# Patient Record
Sex: Female | Born: 1995 | Race: White | Hispanic: No | Marital: Single | Smoking: Never smoker
Health system: Southern US, Community
[De-identification: ages and names within clinical notes are randomized; demographics above are authoritative.]

## PROBLEM LIST (undated history)

## (undated) DIAGNOSIS — Z789 Other specified health status: Secondary | ICD-10-CM

---
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
## 2015-02-01 ENCOUNTER — Encounter (HOSPITAL_COMMUNITY): Payer: Self-pay | Admitting: Emergency Medicine

## 2015-02-01 ENCOUNTER — Emergency Department (INDEPENDENT_AMBULATORY_CARE_PROVIDER_SITE_OTHER)
Admission: EM | Admit: 2015-02-01 | Discharge: 2015-02-01 | Disposition: A | Discharge status: Home / Self Care | Payer: Medicaid Other | Source: Home / Self Care | Attending: Family Medicine | Admitting: Family Medicine

## 2015-02-01 DIAGNOSIS — L247 Irritant contact dermatitis due to plants, except food: Secondary | ICD-10-CM

## 2015-02-01 MED ORDER — PREDNISONE 10 MG PO TABS: ORAL_TABLET | ORAL | Status: DC

## 2015-02-01 MED ORDER — HYDROXYZINE HCL 25 MG PO TABS: 25.0000 mg | ORAL_TABLET | ORAL | Status: DC | PRN

## 2015-02-01 NOTE — Discharge Instructions (Signed)
Poison Ivy  Poison ivy is a inflammation of the skin (contact dermatitis) caused by touching the allergens on the leaves of the ivy plant following previous exposure to the plant. The rash usually appears 48 hours after exposure. The rash is usually bumps (papules) or blisters (vesicles) in a linear pattern. Depending on your own sensitivity, the rash may simply cause redness and itching, or it may also progress to blisters which may break open. These must be well cared for to prevent secondary bacterial (germ) infection, followed by scarring. Keep any open areas dry, clean, dressed, and covered with an antibacterial ointment if needed. The eyes may also get puffy. The puffiness is worst in the morning and gets better as the day progresses. This dermatitis usually heals without scarring, within 2 to 3 weeks without treatment.  HOME CARE INSTRUCTIONS   Thoroughly wash with soap and water as soon as you have been exposed to poison ivy. You have about one half hour to remove the plant resin before it will cause the rash. This washing will destroy the oil or antigen on the skin that is causing, or will cause, the rash. Be sure to wash under your fingernails as any plant resin there will continue to spread the rash. Do not rub skin vigorously when washing affected area. Poison ivy cannot spread if no oil from the plant remains on your body. A rash that has progressed to weeping sores will not spread the rash unless you have not washed thoroughly. It is also important to wash any clothes you have been wearing as these may carry active allergens. The rash will return if you wear the unwashed clothing, even several days later.  Avoidance of the plant in the future is the best measure. Poison ivy plant can be recognized by the number of leaves. Generally, poison ivy has three leaves with flowering branches on a single stem.  Diphenhydramine may be purchased over the counter and used as needed for itching. Do not drive with  this medication if it makes you drowsy.Ask your caregiver about medication for children.  SEEK MEDICAL CARE IF:  · Open sores develop.  · Redness spreads beyond area of rash.  · You notice purulent (pus-like) discharge.  · You have increased pain.  · Other signs of infection develop (such as fever).  Document Released: 11/12/2000 Document Revised: 02/07/2012 Document Reviewed: 04/25/2009  ExitCare® Patient Information ©2015 [employer]. This information is not intended to replace advice given to you by your health care provider. Make sure you discuss any questions you have with your health care provider.    Contact Dermatitis  Contact dermatitis is a reaction to certain substances that touch the skin. Contact dermatitis can be either irritant contact dermatitis or allergic contact dermatitis. Irritant contact dermatitis does not require previous exposure to the substance for a reaction to occur. Allergic contact dermatitis only occurs if you have been exposed to the substance before. Upon a repeat exposure, your body reacts to the substance.   CAUSES   Many substances can cause contact dermatitis. Irritant dermatitis is most commonly caused by repeated exposure to mildly irritating substances, such as:  · Makeup.  · Soaps.  · Detergents.  · Bleaches.  · Acids.  · Metal salts, such as nickel.  Allergic contact dermatitis is most commonly caused by exposure to:  · Poisonous plants.  · Chemicals (deodorants, shampoos).  · Jewelry.  · Latex.  · Neomycin in triple antibiotic cream.  · Preservatives in products, including   clothing.  SYMPTOMS   The area of skin that is exposed may develop:  · Dryness or flaking.  · Redness.  · Cracks.  · Itching.  · Pain or a burning sensation.  · Blisters.  With allergic contact dermatitis, there may also be swelling in areas such as the eyelids, mouth, or genitals.   DIAGNOSIS   Your caregiver can usually tell what the problem is by doing a physical exam. In cases where the cause is  uncertain and an allergic contact dermatitis is suspected, a patch skin test may be performed to help determine the cause of your dermatitis.  TREATMENT  Treatment includes protecting the skin from further contact with the irritating substance by avoiding that substance if possible. Barrier creams, powders, and gloves may be helpful. Your caregiver may also recommend:  · Steroid creams or ointments applied 2 times daily. For best results, soak the rash area in cool water for 20 minutes. Then apply the medicine. Cover the area with a plastic wrap. You can store the steroid cream in the refrigerator for a "chilly" effect on your rash. That may decrease itching. Oral steroid medicines may be needed in more severe cases.  · Antibiotics or antibacterial ointments if a skin infection is present.  · Antihistamine lotion or an antihistamine taken by mouth to ease itching.  · Lubricants to keep moisture in your skin.  · Burow's solution to reduce redness and soreness or to dry a weeping rash. Mix one packet or tablet of solution in 2 cups cool water. Dip a clean washcloth in the mixture, wring it out a bit, and put it on the affected area. Leave the cloth in place for 30 minutes. Do this as often as possible throughout the day.  · Taking several cornstarch or baking soda baths daily if the area is too large to cover with a washcloth.  Harsh chemicals, such as alkalis or acids, can cause skin damage that is like a burn. You should flush your skin for 15 to 20 minutes with cold water after such an exposure. You should also seek immediate medical care after exposure. Bandages (dressings), antibiotics, and pain medicine may be needed for severely irritated skin.   HOME CARE INSTRUCTIONS  · Avoid the substance that caused your reaction.  · Keep the area of skin that is affected away from hot water, soap, sunlight, chemicals, acidic substances, or anything else that would irritate your skin.  · Do not scratch the rash. Scratching  may cause the rash to become infected.  · You may take cool baths to help stop the itching.  · Only take over-the-counter or prescription medicines as directed by your caregiver.  · See your caregiver for follow-up care as directed to make sure your skin is healing properly.  SEEK MEDICAL CARE IF:   · Your condition is not better after 3 days of treatment.  · You seem to be getting worse.  · You see signs of infection such as swelling, tenderness, redness, soreness, or warmth in the affected area.  · You have any problems related to your medicines.  Document Released: 11/12/2000 Document Revised: 02/07/2012 Document Reviewed: 04/20/2011  ExitCare® Patient Information ©2015 [employer]. This information is not intended to replace advice given to you by your health care provider. Make sure you discuss any questions you have with your health care provider.

## 2015-02-01 NOTE — ED Notes (Signed)
Patient c/o poison ivy on her face arms and bilat thighs x 4 days. Patient reports she thinks they cat was in poison ivy. Patient reports she took a benadryl and felt better. Also has a sore throat not sure if it related to the poison ivy. Denies trouble breathing. Patient is in NAD.

## 2015-02-01 NOTE — ED Provider Notes (Signed)
CSN: [id number]     Arrival date & time 02/01/15  1807 History   First MD Initiated Contact with Patient 02/01/15 1858     Chief Complaint  Patient presents with  . Poison Ivy   (Consider location/radiation/quality/duration/timing/severity/associated sxs/prior Treatment) HPI       19 year old female presents for evaluation of poison ivy. She has an itchy rash consistent with poison ivy on her left arm, left cheek, and spread out multiple places over the rest of her body. She also has some soreness in her throat. She has gotten poison ivy in the past from her cat, she says her cat runs outside and has passed her poison ivy this way. No difficulty breathing or throat swelling.  History reviewed. No pertinent past medical history. History reviewed. No pertinent past surgical history. History reviewed. No pertinent family history. History  Substance Use Topics  . Smoking status: Light Tobacco Smoker  . Smokeless tobacco: Not on file  . Alcohol Use: Not on file   OB History    No data available     Review of Systems  Constitutional: Negative for fever and chills.  HENT: Positive for sore throat. Negative for congestion.   Respiratory: Negative for shortness of breath.   Cardiovascular: Negative for chest pain.  Musculoskeletal: Negative for myalgias and arthralgias.  Skin: Positive for rash.  All other systems reviewed and are negative.   Allergies  Review of patient's allergies indicates no known allergies.  Home Medications   Prior to Admission medications   Medication Sig Start Date End Date Taking? Authorizing Provider  hydrOXYzine (ATARAX/VISTARIL) 25 MG tablet Take 1 tablet (25 mg total) by mouth every 4 (four) hours as needed for itching. 02/01/15   Graylon Good, PA-C  predniSONE (DELTASONE) 10 MG tablet 4 tabs PO QD for 4 days; 3 tabs PO QD for 3 days; 2 tabs PO QD for 2 days; 1 tab PO QD for 1 day 02/01/15   Graylon Good, PA-C   BP 120/52 mmHg  Pulse 67  Temp(Src) 99  F (37.2 C) (Oral)  Resp 14  SpO2 98%  LMP 01/30/2015 (Exact Date) Physical Exam  Constitutional: She is oriented to person, place, and time. Vital signs are normal. She appears well-developed and well-nourished. No distress.  HENT:  Head: Normocephalic and atraumatic.  No posterior pharyngeal edema  Pulmonary/Chest: Effort normal. No respiratory distress.  Neurological: She is alert and oriented to person, place, and time. She has normal strength. Coordination normal.  Skin: Skin is warm and dry. Rash ( there is an erythematous maculopapular and crusting rash in a linear distribution in the areas described in the history of present illness) noted. She is not diaphoretic.  Psychiatric: She has a normal mood and affect. Judgment normal.  Nursing note and vitals reviewed.   ED Course  Procedures (including critical care time) Labs Review Labs Reviewed - No data to display  Imaging Review No results found.   MDM   1. Irritant contact dermatitis due to plant    Treat with prednisone, hydroxyzine. Follow-up when necessary if worsening   Meds ordered this encounter  Medications  . predniSONE (DELTASONE) 10 MG tablet    Sig: 4 tabs PO QD for 4 days; 3 tabs PO QD for 3 days; 2 tabs PO QD for 2 days; 1 tab PO QD for 1 day    Dispense:  30 tablet    Refill:  0  . hydrOXYzine (ATARAX/VISTARIL) 25 MG tablet  Sig: Take 1 tablet (25 mg total) by mouth every 4 (four) hours as needed for itching.    Dispense:  20 tablet    Refill:  0       Graylon GoodZachary H Maykayla Highley, PA-C 02/01/15 1911

## 2016-03-21 ENCOUNTER — Encounter (HOSPITAL_COMMUNITY): Payer: Self-pay

## 2016-03-21 ENCOUNTER — Emergency Department (HOSPITAL_COMMUNITY)
Admission: EM | Admit: 2016-03-21 | Discharge: 2016-03-21 | Disposition: A | Discharge status: Home / Self Care | Payer: Medicaid Other | Attending: Emergency Medicine | Admitting: Emergency Medicine

## 2016-03-21 DIAGNOSIS — T63481A Toxic effect of venom of other arthropod, accidental (unintentional), initial encounter: Secondary | ICD-10-CM | POA: Diagnosis not present

## 2016-03-21 DIAGNOSIS — F17200 Nicotine dependence, unspecified, uncomplicated: Secondary | ICD-10-CM | POA: Diagnosis not present

## 2016-03-21 DIAGNOSIS — Z23 Encounter for immunization: Secondary | ICD-10-CM | POA: Diagnosis not present

## 2016-03-21 DIAGNOSIS — Y998 Other external cause status: Secondary | ICD-10-CM | POA: Diagnosis not present

## 2016-03-21 DIAGNOSIS — Y9389 Activity, other specified: Secondary | ICD-10-CM | POA: Insufficient documentation

## 2016-03-21 DIAGNOSIS — Y9289 Other specified places as the place of occurrence of the external cause: Secondary | ICD-10-CM | POA: Insufficient documentation

## 2016-03-21 DIAGNOSIS — R21 Rash and other nonspecific skin eruption: Secondary | ICD-10-CM | POA: Diagnosis present

## 2016-03-21 MED ORDER — PREDNISONE 20 MG PO TABS
40.0000 mg | ORAL_TABLET | Freq: Once | ORAL | Status: AC
  Administered 2016-03-21: 40 mg via ORAL
  Filled 2016-03-21: qty 2

## 2016-03-21 MED ORDER — TETANUS-DIPHTH-ACELL PERTUSSIS 5-2.5-18.5 LF-MCG/0.5 IM SUSP
0.5000 mL | Freq: Once | INTRAMUSCULAR | Status: AC
  Administered 2016-03-21: 0.5 mL via INTRAMUSCULAR
  Filled 2016-03-21: qty 0.5

## 2016-03-21 MED ORDER — FAMOTIDINE 20 MG PO TABS
20.0000 mg | ORAL_TABLET | Freq: Once | ORAL | Status: AC
  Administered 2016-03-21: 20 mg via ORAL
  Filled 2016-03-21: qty 1

## 2016-03-21 MED ORDER — PREDNISONE 10 MG PO TABS: 20.0000 mg | ORAL_TABLET | Freq: Two times a day (BID) | ORAL | Status: DC

## 2016-03-21 MED ORDER — FAMOTIDINE 20 MG PO TABS: 20.0000 mg | ORAL_TABLET | Freq: Two times a day (BID) | ORAL | Status: DC

## 2016-03-21 MED ORDER — DIPHENHYDRAMINE HCL 25 MG PO CAPS
25.0000 mg | ORAL_CAPSULE | Freq: Once | ORAL | Status: AC
  Administered 2016-03-21: 25 mg via ORAL
  Filled 2016-03-21: qty 1

## 2016-03-21 MED ORDER — LORATADINE 10 MG PO TABS: 10.0000 mg | ORAL_TABLET | Freq: Every day | ORAL | Status: DC

## 2016-03-21 NOTE — ED Provider Notes (Signed)
CSN: [id number]     Arrival date & time 03/21/16  1839 History   First MD Initiated Contact with Patient 03/21/16 1859     Chief Complaint  Patient presents with  . Rash     (Consider location/radiation/quality/duration/timing/severity/associated sxs/prior Treatment) The history is provided by the patient.   Tina Walsh is a 20 y.o. female who presents to the ED with an area to the left shoulder that she thinks may be a spider bite. She reports that she camped out last night and something bit her. She denies difficulty swallowing or sore throat, wheezing, shortness of breath or other problems.   History reviewed. No pertinent past medical history. History reviewed. No pertinent past surgical history. History reviewed. No pertinent family history. Social History  Substance Use Topics  . Smoking status: Light Tobacco Smoker  . Smokeless tobacco: None  . Alcohol Use: No   OB History    No data available     Review of Systems Negative except as stated in HPI   Allergies  Review of patient's allergies indicates no known allergies.  Home Medications   Prior to Admission medications   Medication Sig Start Date End Date Taking? Authorizing Provider  famotidine (PEPCID) 20 MG tablet Take 1 tablet (20 mg total) by mouth 2 (two) times daily. 03/21/16   Kadeisha Betsch Orlene OchM Deliliah Spranger, NP  loratadine (CLARITIN) 10 MG tablet Take 1 tablet (10 mg total) by mouth daily. 03/21/16   Danie Diehl Orlene OchM Ladeja Pelham, NP  predniSONE (DELTASONE) 10 MG tablet Take 2 tablets (20 mg total) by mouth 2 (two) times daily with a meal. 03/21/16   Sabriel Borromeo Orlene OchM Tijah Hane, NP   There were no vitals taken for this visit. Physical Exam  Constitutional: She is oriented to person, place, and time. She appears well-developed and well-nourished. No distress.  HENT:  Head: Normocephalic and atraumatic.  Mouth/Throat: Uvula is midline, oropharynx is clear and moist and mucous membranes are normal.  Eyes: Conjunctivae and EOM are normal.  Neck: Normal  range of motion. Neck supple.  Cardiovascular: Normal rate and regular rhythm.   Pulmonary/Chest: Effort normal and breath sounds normal.  Musculoskeletal: Normal range of motion.  Neurological: She is alert and oriented to person, place, and time. No cranial nerve deficit.  Skin: Skin is warm and dry.  There is a 3 cm area of erythema with a central tiny vesicle. There is no red streaking or signs of infection.  Psychiatric: She has a normal mood and affect. Her behavior is normal.  Nursing note and vitals reviewed.   ED Course  Procedures (including critical care time) Labs Review Labs Reviewed - No data to display   MDM  20 y.o. female with redness and itching to the back c/w insect bite. Stable for d/c without swelling of the throat, wheezing or signs of infection. Will treat with Prednisone, Claritin, Pepcid and she will return for worsening symptoms. Tetanus updated.   Final diagnoses:  Local reaction to insect sting, accidental or unintentional, initial encounter        [facility] M Vyom Brass, NP 03/21/16 1932  Gerhard Munchobert Lockwood, MD 03/22/16 [phone]

## 2016-03-21 NOTE — Discharge Instructions (Signed)
Return if you have increased redness with red streaking, wheezing, swelling of your throat or drainage from the area or any other problems.

## 2016-03-21 NOTE — ED Notes (Signed)
Pt states she was camping last night, noticed "spider bite" this afternoon. Pt has large red rash to L upper back.

## 2016-03-21 NOTE — ED Notes (Signed)
See providers assessment.  

## 2016-03-21 NOTE — ED Notes (Signed)
Pt called x'3, no response 

## 2016-04-14 ENCOUNTER — Emergency Department (HOSPITAL_COMMUNITY): Payer: Medicaid Other | Admitting: Anesthesiology

## 2016-04-14 ENCOUNTER — Emergency Department (HOSPITAL_COMMUNITY): Payer: Medicaid Other

## 2016-04-14 ENCOUNTER — Encounter (HOSPITAL_COMMUNITY)
Admission: EM | Disposition: A | Discharge status: Home / Self Care | Payer: Self-pay | Source: Home / Self Care | Attending: Emergency Medicine

## 2016-04-14 ENCOUNTER — Ambulatory Visit (HOSPITAL_COMMUNITY)
Admission: EM | Admit: 2016-04-14 | Discharge: 2016-04-15 | Disposition: A | Discharge status: Home / Self Care | Payer: Medicaid Other | Attending: Emergency Medicine | Admitting: Emergency Medicine

## 2016-04-14 ENCOUNTER — Encounter (HOSPITAL_COMMUNITY): Payer: Self-pay | Admitting: Emergency Medicine

## 2016-04-14 DIAGNOSIS — S61431A Puncture wound without foreign body of right hand, initial encounter: Secondary | ICD-10-CM

## 2016-04-14 DIAGNOSIS — S61441A Puncture wound with foreign body of right hand, initial encounter: Secondary | ICD-10-CM | POA: Diagnosis not present

## 2016-04-14 DIAGNOSIS — S60551A Superficial foreign body of right hand, initial encounter: Secondary | ICD-10-CM | POA: Diagnosis present

## 2016-04-14 DIAGNOSIS — W2209XA Striking against other stationary object, initial encounter: Secondary | ICD-10-CM | POA: Diagnosis not present

## 2016-04-14 DIAGNOSIS — F17200 Nicotine dependence, unspecified, uncomplicated: Secondary | ICD-10-CM | POA: Insufficient documentation

## 2016-04-14 DIAGNOSIS — W228XXA Striking against or struck by other objects, initial encounter: Secondary | ICD-10-CM | POA: Diagnosis not present

## 2016-04-14 HISTORY — PX: I & D EXTREMITY: SHX5045

## 2016-04-14 LAB — CBC WITH DIFFERENTIAL/PLATELET
Basophils Absolute: 0 10*3/uL (ref 0.0–0.1)
Basophils Relative: 0 %
Eosinophils Absolute: 0.2 10*3/uL (ref 0.0–0.7)
Eosinophils Relative: 2 %
HEMATOCRIT: 33.6 % — AB (ref 36.0–46.0)
HEMOGLOBIN: 11.2 g/dL — AB (ref 12.0–15.0)
LYMPHS ABS: 1.5 10*3/uL (ref 0.7–4.0)
LYMPHS PCT: 18 %
MCH: 28.9 pg (ref 26.0–34.0)
MCHC: 33.3 g/dL (ref 30.0–36.0)
MCV: 86.8 fL (ref 78.0–100.0)
MONOS PCT: 6 %
Monocytes Absolute: 0.5 10*3/uL (ref 0.1–1.0)
NEUTROS ABS: 6.5 10*3/uL (ref 1.7–7.7)
NEUTROS PCT: 74 %
Platelets: 254 10*3/uL (ref 150–400)
RBC: 3.87 MIL/uL (ref 3.87–5.11)
RDW: 13.7 % (ref 11.5–15.5)
WBC: 8.8 10*3/uL (ref 4.0–10.5)

## 2016-04-14 LAB — BASIC METABOLIC PANEL
Anion gap: 7 (ref 5–15)
BUN: 13 mg/dL (ref 6–20)
CHLORIDE: 106 mmol/L (ref 101–111)
CO2: 26 mmol/L (ref 22–32)
CREATININE: 0.65 mg/dL (ref 0.44–1.00)
Calcium: 9.2 mg/dL (ref 8.9–10.3)
GFR calc Af Amer: >60 mL/min (ref >60)
GFR calc non Af Amer: >60 mL/min (ref >60)
Glucose, Bld: 105 mg/dL — ABNORMAL HIGH (ref 65–99)
POTASSIUM: 3.9 mmol/L (ref 3.5–5.1)
Sodium: 139 mmol/L (ref 135–145)

## 2016-04-14 SURGERY — IRRIGATION AND DEBRIDEMENT EXTREMITY
Anesthesia: General | Site: Hand | Laterality: Right

## 2016-04-14 MED ORDER — MIDAZOLAM HCL 2 MG/2ML IJ SOLN
INTRAMUSCULAR | Status: AC
  Filled 2016-04-14: qty 2

## 2016-04-14 MED ORDER — CEFAZOLIN SODIUM 1 G IJ SOLR
INTRAMUSCULAR | Status: AC
  Filled 2016-04-14: qty 40

## 2016-04-14 MED ORDER — FENTANYL CITRATE (PF) 250 MCG/5ML IJ SOLN
INTRAMUSCULAR | Status: AC
  Filled 2016-04-14: qty 5

## 2016-04-14 MED ORDER — CEFAZOLIN SODIUM 1-5 GM-% IV SOLN
1.0000 g | Freq: Once | INTRAVENOUS | Status: AC
  Administered 2016-04-15: 1 g via INTRAVENOUS

## 2016-04-14 MED ORDER — MORPHINE SULFATE (PF) 4 MG/ML IV SOLN
4.0000 mg | Freq: Once | INTRAVENOUS | Status: AC
Start: 2016-04-14 — End: 2016-04-14
  Administered 2016-04-14: 4 mg via INTRAVENOUS
  Filled 2016-04-14: qty 1

## 2016-04-14 MED ORDER — MORPHINE SULFATE (PF) 4 MG/ML IV SOLN
4.0000 mg | Freq: Once | INTRAVENOUS | Status: AC
  Administered 2016-04-14: 4 mg via INTRAVENOUS
  Filled 2016-04-14: qty 1

## 2016-04-14 SURGICAL SUPPLY — 52 items
BANDAGE ELASTIC 3 VELCRO ST LF (GAUZE/BANDAGES/DRESSINGS) IMPLANT
BANDAGE ELASTIC 4 VELCRO ST LF (GAUZE/BANDAGES/DRESSINGS) ×2 IMPLANT
BNDG CMPR 9X4 STRL LF SNTH (GAUZE/BANDAGES/DRESSINGS)
BNDG CONFORM 2 STRL LF (GAUZE/BANDAGES/DRESSINGS) IMPLANT
BNDG ESMARK 4X9 LF (GAUZE/BANDAGES/DRESSINGS) IMPLANT
BNDG GAUZE ELAST 4 BULKY (GAUZE/BANDAGES/DRESSINGS) ×2 IMPLANT
CORDS BIPOLAR (ELECTRODE) IMPLANT
COVER SURGICAL LIGHT HANDLE (MISCELLANEOUS) IMPLANT
CUFF TOURNIQUET SINGLE 18IN (TOURNIQUET CUFF) IMPLANT
DECANTER SPIKE VIAL GLASS SM (MISCELLANEOUS) IMPLANT
DRAIN PENROSE 1/4X12 LTX STRL (WOUND CARE) IMPLANT
DRAPE SURG 17X23 STRL (DRAPES) IMPLANT
DRSG PAD ABDOMINAL 8X10 ST (GAUZE/BANDAGES/DRESSINGS) IMPLANT
DURAPREP 26ML APPLICATOR (WOUND CARE) IMPLANT
ELECT REM PT RETURN 9FT ADLT (ELECTROSURGICAL)
ELECTRODE REM PT RTRN 9FT ADLT (ELECTROSURGICAL) IMPLANT
GAUZE PACKING IODOFORM 1/4X5 (PACKING) IMPLANT
GAUZE SPONGE 4X4 12PLY STRL (GAUZE/BANDAGES/DRESSINGS) IMPLANT
GAUZE XEROFORM 1X8 LF (GAUZE/BANDAGES/DRESSINGS) IMPLANT
GLOVE SURG SYN 8.0 (GLOVE) ×2 IMPLANT
GOWN STRL REUS W/ TWL LRG LVL3 (GOWN DISPOSABLE) ×1 IMPLANT
GOWN STRL REUS W/ TWL XL LVL3 (GOWN DISPOSABLE) IMPLANT
GOWN STRL REUS W/TWL LRG LVL3 (GOWN DISPOSABLE) ×2
GOWN STRL REUS W/TWL XL LVL3 (GOWN DISPOSABLE)
HANDPIECE INTERPULSE COAX TIP (DISPOSABLE)
KIT BASIN OR (CUSTOM PROCEDURE TRAY) ×2 IMPLANT
KIT ROOM TURNOVER OR (KITS) IMPLANT
MANIFOLD NEPTUNE II (INSTRUMENTS) IMPLANT
NEEDLE HYPO 25GX1X1/2 BEV (NEEDLE) IMPLANT
NEEDLE HYPO 25X1 1.5 SAFETY (NEEDLE) ×2 IMPLANT
NS IRRIG 1000ML POUR BTL (IV SOLUTION) ×2 IMPLANT
PACK ORTHO EXTREMITY (CUSTOM PROCEDURE TRAY) IMPLANT
PAD ARMBOARD 7.5X6 YLW CONV (MISCELLANEOUS) ×4 IMPLANT
PAD CAST 4YDX4 CTTN HI CHSV (CAST SUPPLIES) ×2 IMPLANT
PADDING CAST COTTON 4X4 STRL (CAST SUPPLIES) ×4
SCRUB BETADINE 4OZ XXX (MISCELLANEOUS) ×2 IMPLANT
SET HNDPC FAN SPRY TIP SCT (DISPOSABLE) IMPLANT
SOLUTION BETADINE 4OZ (MISCELLANEOUS) ×2 IMPLANT
SPONGE GAUZE 4X4 12PLY STER LF (GAUZE/BANDAGES/DRESSINGS) ×2 IMPLANT
SPONGE LAP 18X18 X RAY DECT (DISPOSABLE) IMPLANT
SUCTION FRAZIER HANDLE 10FR (MISCELLANEOUS)
SUCTION TUBE FRAZIER 10FR DISP (MISCELLANEOUS) IMPLANT
SUT VICRYL RAPIDE 4/0 PS 2 (SUTURE) IMPLANT
SYR 20CC LL (SYRINGE) ×2 IMPLANT
SYR CONTROL 10ML LL (SYRINGE) IMPLANT
TOWEL OR 17X24 6PK STRL BLUE (TOWEL DISPOSABLE) IMPLANT
TOWEL OR 17X26 10 PK STRL BLUE (TOWEL DISPOSABLE) ×2 IMPLANT
TUBE ANAEROBIC SPECIMEN COL (MISCELLANEOUS) IMPLANT
TUBE CONNECTING 12X1/4 (SUCTIONS) ×2 IMPLANT
UNDERPAD 30X30 INCONTINENT (UNDERPADS AND DIAPERS) IMPLANT
WATER STERILE IRR 1000ML POUR (IV SOLUTION) IMPLANT
YANKAUER SUCT BULB TIP NO VENT (SUCTIONS) IMPLANT

## 2016-04-14 NOTE — ED Notes (Signed)
Pt in EMS. Pt was impaled by chain link fence while trying to catch a dog. Pt rates pain 6/10. PVA intact. Given 150 fentanyl en route. VSS

## 2016-04-14 NOTE — Anesthesia Preprocedure Evaluation (Signed)
Anesthesia Evaluation  Patient identified by MRN, date of birth, ID band Patient awake    Reviewed: Allergy & Precautions, H&P , NPO status , Patient's Chart, lab work & pertinent test results  History of Anesthesia Complications Negative for: history of anesthetic complications  Airway Mallampati: I   Neck ROM: full    Dental no notable dental hx.    Pulmonary neg pulmonary ROS, Current Smoker,    Pulmonary exam normal breath sounds clear to auscultation       Cardiovascular negative cardio ROS  I Rhythm:regular Rate:Normal     Neuro/Psych negative neurological ROS  negative psych ROS   GI/Hepatic negative GI ROS, Neg liver ROS,   Endo/Other  negative endocrine ROS  Renal/GU negative Renal ROS  negative genitourinary   Musculoskeletal   Abdominal   Peds  Hematology negative hematology ROS (+)   Anesthesia Other Findings   Reproductive/Obstetrics negative OB ROS                             Anesthesia Physical Anesthesia Plan  ASA: I and emergent  Anesthesia Plan: General   Post-op Pain Management:    Induction:   Airway Management Planned:   Additional Equipment:   Intra-op Plan:   Post-operative Plan:   Informed Consent: I have reviewed the patients History and Physical, chart, labs and discussed the procedure including the risks, benefits and alternatives for the proposed anesthesia with the patient or authorized representative who has indicated his/her understanding and acceptance.     Plan Discussed with: CRNA and Surgeon  Anesthesia Plan Comments:         Anesthesia Quick Evaluation

## 2016-04-14 NOTE — ED Notes (Signed)
Hand surgeon at bedside. 

## 2016-04-14 NOTE — Consult Note (Signed)
Reason for Consult:right hand foreign body Referring Physician: Sigurd Walsh  Tina Walsh is an 20 y.o. female.  HPI: s/p fall on to fence with embedded metal in palm of right hand  History reviewed. No pertinent past medical history.  History reviewed. No pertinent past surgical history.  No family history on file.  Social History:  reports that she has been smoking.  She does not have any smokeless tobacco history on file. She reports that she does not drink alcohol. Her drug history is not on file.  Allergies: No Known Allergies  Medications: Scheduled:   No results found for this or any previous visit (from the past 48 hour(s)).  Dg Hand Complete Right  04/14/2016  CLINICAL DATA:  First 20 year old female with metal fence broke off into her right hand. EXAM: RIGHT HAND - COMPLETE 3+ VIEW COMPARISON:  Radiograph dated 02/01/2011 FINDINGS: There is a curvilinear metallic density extending into the soft tissues of the ulnar aspect of the hand. At the base of the fifth metacarpal. The wire appears deep in the soft tissue and extends to the level of the metacarpal cortex. No definite acute fracture identified. The bones are well mineralized. There is no dislocation. No soft tissue gas noted. IMPRESSION: Metallic wire extending deep into the soft tissues of the ulnar aspect of the and at the level of the base of the fifth metacarpal. The wire appears to extend to the level of the bone. No acute osseous pathology identified. Electronically Signed   By: Tina Walsh  Tina Walsh M.D.   On: 04/14/2016 21:02    Review of Systems  All other systems reviewed and are negative.  Blood pressure 116/60, pulse 60, temperature 98 F (36.7 C), temperature source Oral, resp. rate 18, last menstrual period 03/02/2016, SpO2 99 %. Physical Exam  Constitutional: She is oriented to person, place, and time. She appears well-developed and well-nourished.  HENT:  Head: Normocephalic and atraumatic.  Neck: Normal  range of motion.  Cardiovascular: Normal rate.   Respiratory: Effort normal.  Musculoskeletal:       Right hand: She exhibits tenderness and swelling.  Part of fence in palmar aspect of right hand  Some numbness in small finger  Neurological: She is alert and oriented to person, place, and time.  Skin: Skin is warm.  Psychiatric: She has a normal mood and affect. Her behavior is normal. Judgment and thought content normal.    Assessment/Plan: As above  Plan OR for removal  Tina Walsh A 04/14/2016, 9:58 PM

## 2016-04-14 NOTE — ED Notes (Signed)
Pt placed in gown, belongings given to family member in room. Pt waiting for OR

## 2016-04-14 NOTE — ED Notes (Signed)
Pt mother at nurses station tearful. States that her daughter has a lot of anxiety and "we need to knock her out completely" and we need to give her more pain medicine. Kindly explained to pt that the EDP has not even been in to see the pt yet. They need to enter and thoroughly assess in order to determine a plan of care for the pt.

## 2016-04-14 NOTE — ED Provider Notes (Signed)
CSN: [id number]     Arrival date & time 04/14/16  1946 History  By signing my name below, I, Tina Walsh, attest that this documentation has been prepared under the direction and in the presence of [address] Tina Walsh, [employer].   Electronically Signed: Iona Walsh, ED Scribe 04/14/2016 at 9:11 PM.   Chief Complaint  Patient presents with  . Hand Injury  . Puncture Wound    Patient is a 20 y.o. female presenting with hand pain. The history is provided by the patient. No language interpreter was used.  Hand Pain This is a new problem. The current episode started less than 1 hour ago. The problem occurs constantly. The problem has not changed since onset.Pertinent negatives include no chest pain, no abdominal pain and no shortness of breath. Exacerbated by: Movement of hand. The symptoms are relieved by narcotics (Fentanyl). Treatments tried: fentanyl. The treatment provided moderate relief.   HPI Comments: Tina Walsh is a 20 y.o. otherwise healthy female who presents to the Emergency Department via EMS complaining of sudden onset, 7-8/10, constant, non-radiating, throbbing, right hand pain onset about 45 minutes ago when she impaled her hand on a chain link fence while trying to catch a dog. Pt notes associated ecchymosis and swelling in addition to the puncture wound from the chain link fence still being impaled on her hand. The pain is worse with movement. Pt received 150 mcg of fentanyl PTA in ED with temporary relief to pain. No other associated symptoms noted. No other worsening or alleviating factors noted. Pt denies chest pain, shortness of breath, abdominal pain, nausea, vomiting, numbness, tingling, weakness, syncope/lightheadedness, bleeding disorders or blood thinners, wrist/forearm pain, other injuries, or any other pertinent symptoms. NKDA. Pt's last meal was about 2-3 hours ago. Pt and her family state she is very anxious and wants to be "knocked out" to get the fence off  out of her hand.   History reviewed. No pertinent past medical history. History reviewed. No pertinent past surgical history. No family history on file. Social History  Substance Use Topics  . Smoking status: Light Tobacco Smoker  . Smokeless tobacco: None  . Alcohol Use: No   OB History    No data available     Review of Systems  Respiratory: Negative for shortness of breath.   Cardiovascular: Negative for chest pain.  Gastrointestinal: Negative for nausea, vomiting and abdominal pain.  Musculoskeletal: Positive for myalgias, joint swelling and arthralgias.  Skin: Positive for color change and wound (Right hand impalement by chain link fence).  Allergic/Immunologic: Negative for immunocompromised state.  Neurological: Negative for syncope, weakness, light-headedness and numbness.  Hematological: Does not bruise/bleed easily.  Psychiatric/Behavioral: Negative for confusion.   10 Systems reviewed and all are negative for acute change except as noted in the HPI.  Allergies  Review of patient's allergies indicates no known allergies.  Home Medications   Prior to Admission medications   Medication Sig Start Date End Date Taking? Authorizing Provider  famotidine (PEPCID) 20 MG tablet Take 1 tablet (20 mg total) by mouth 2 (two) times daily. 03/21/16   Tina Orlene Och, NP  loratadine (CLARITIN) 10 MG tablet Take 1 tablet (10 mg total) by mouth daily. 03/21/16   Tina Orlene Och, NP  predniSONE (DELTASONE) 10 MG tablet Take 2 tablets (20 mg total) by mouth 2 (two) times daily with a meal. 03/21/16   Tina Orlene Och, NP   BP 116/60 mmHg  Pulse 60  Temp(Src) 98 F (36.7 C) (  Oral)  Resp 18  SpO2 100% Physical Exam  Constitutional: She is oriented to person, place, and time. Vital signs are normal. She appears well-developed and well-nourished.  Non-toxic appearance. No distress.  Afebrile, nontoxic, NAD  HENT:  Head: Normocephalic and atraumatic.  Mouth/Throat: Mucous membranes are  normal.  Eyes: Conjunctivae and EOM are normal. Right eye exhibits no discharge. Left eye exhibits no discharge.  Neck: Normal range of motion. Neck supple.  Cardiovascular: Normal rate and intact distal pulses.   Pulmonary/Chest: Effort normal. No respiratory distress.  Abdominal: Normal appearance. She exhibits no distension.  Musculoskeletal: Normal range of motion.       Right forearm: She exhibits tenderness, bony tenderness, swelling, edema and laceration.  Right hand with portion of chain link fence impaled through the hypothenar eminence, bleeding controlled, with focal TTP along the 5th metacarpal region, with mild swelling but no bruising, FROM intact in all digits, grip strength grossly intact, sensation grossly intact, distal pulses intact with adequate cap refill. No other focal tenderness to remaining wrist/forearm SEE PICTURE BELOW    Neurological: She is alert and oriented to person, place, and time. She has normal strength. No sensory deficit.  Skin: Skin is warm and dry. Laceration noted. No rash noted.  Right hand wound as noted above and pictured below.   Psychiatric: She has a normal mood and affect. Her behavior is normal.  Nursing note and vitals reviewed.      ED Course  Procedures (including critical care time) DIAGNOSTIC STUDIES: Oxygen Saturation is 100% on RA, normal by my interpretation.    COORDINATION OF CARE: 8:17 PM Discussed treatment plan with pt at bedside and pt agreed to plan.  Labs Review Labs Reviewed - No data to display  Imaging Review Dg Hand Complete Right  04/14/2016  CLINICAL DATA:  First 20 year old female with metal fence broke off into her right hand. EXAM: RIGHT HAND - COMPLETE 3+ VIEW COMPARISON:  Radiograph dated 02/01/2011 FINDINGS: There is a curvilinear metallic density extending into the soft tissues of the ulnar aspect of the hand. At the base of the fifth metacarpal. The wire appears deep in the soft tissue and extends to the  level of the metacarpal cortex. No definite acute fracture identified. The bones are well mineralized. There is no dislocation. No soft tissue gas noted. IMPRESSION: Metallic wire extending deep into the soft tissues of the ulnar aspect of the and at the level of the base of the fifth metacarpal. The wire appears to extend to the level of the bone. No acute osseous pathology identified. Electronically Signed   By: Elgie Collard M.D.   On: 04/14/2016 21:02   I have personally reviewed and evaluated these images and lab results as part of my medical decision-making.   EKG Interpretation None      MDM   Final diagnoses:  Puncture wound of right hand, initial encounter  Acute foreign body of right hand, initial encounter  Struck fence without fall    20 y.o. female here with chain link fence impaled on R hand. NVI with soft compartments, bleeding controlled. Port of fence still impaled in the hand upon arrival. Focal tenderness over 5th metacarpal. No other tenderness elsewhere on the wrist/forearm. Will obtain xray and give pain meds, will hold off on consultation with hand specialist until we obtain the xray imaging. Tetanus UTD. Will reassess shortly  9:18 PM Xray showing metallic object into the soft tissues and curved around towards the level of the  bone. No acute fx identified. Given pt anxiety and demands to be "knocked out", as well as the complexity of attempting to remove this object here, will consult the hand surgeon to discuss option of removal in the OR. Will await his return page. Pt still in pain, will order more pain medication.  9:29 PM Dr. Mina MarbleWeingold returning page, will come evaluate pt for potential OR removal. He believes they'd likely want to wait several more hours since she ate ~5hrs ago, but will come assess pt now. Will await further instruction. Will add on labs in the event that she does go to the OR tonight.  9:49 PM Dr. Mina MarbleWeingold will take pt to OR for removal of  the fence fragment. Please see his notes for further documentation of care/dispo. GREATLY APPRECIATE HIS HELP WITH THIS PATIENT! Pt stable at this time, labs being drawn now.   I personally performed the services described in this documentation, which was scribed in my presence. The recorded information has been reviewed and is accurate.  BP 116/60 mmHg  Pulse 60  Temp(Src) 98 F (36.7 C) (Oral)  Resp 18  SpO2 99%  LMP 03/02/2016 (Approximate)  Meds ordered this encounter  Medications  . morphine 4 MG/ML injection 4 mg    Sig:   . morphine 4 MG/ML injection 4 mg    Sig:   . ceFAZolin (ANCEF) IVPB 1 g/50 mL premix    Sig:     Order Specific Question:  Antibiotic Indication:    Answer:  Surgical Prophylaxis         Nadezhda Pollitt Camprubi-Soms, PA-C 04/14/16 2150  Zadie Rhineonald Wickline, MD 04/16/16 [phone]

## 2016-04-15 ENCOUNTER — Encounter (HOSPITAL_COMMUNITY): Payer: Self-pay | Admitting: Orthopedic Surgery

## 2016-04-15 MED ORDER — OXYCODONE-ACETAMINOPHEN 5-325 MG PO TABS: 1.0000 | ORAL_TABLET | ORAL | Status: DC | PRN

## 2016-04-15 MED ORDER — PROPOFOL 10 MG/ML IV BOLUS
INTRAVENOUS | Status: DC | PRN
Start: 2016-04-15 — End: 2016-04-15
  Administered 2016-04-15: 150 mg via INTRAVENOUS

## 2016-04-15 MED ORDER — LIDOCAINE HCL (CARDIAC) 20 MG/ML IV SOLN
INTRAVENOUS | Status: DC | PRN
  Administered 2016-04-15: 60 mg via INTRATRACHEAL

## 2016-04-15 MED ORDER — MIDAZOLAM HCL 2 MG/2ML IJ SOLN
INTRAMUSCULAR | Status: DC | PRN
  Administered 2016-04-14: 2 mg via INTRAVENOUS

## 2016-04-15 MED ORDER — SODIUM CHLORIDE 0.9 % IR SOLN
Status: DC | PRN
  Administered 2016-04-15: 1000 mL

## 2016-04-15 MED ORDER — HYDROMORPHONE HCL 1 MG/ML IJ SOLN: 0.2500 mg | INTRAMUSCULAR | Status: DC | PRN

## 2016-04-15 MED ORDER — FENTANYL CITRATE (PF) 250 MCG/5ML IJ SOLN
INTRAMUSCULAR | Status: DC | PRN
  Administered 2016-04-15: 50 ug via INTRAVENOUS

## 2016-04-15 MED ORDER — LACTATED RINGERS IV SOLN
INTRAVENOUS | Status: DC | PRN
  Administered 2016-04-14: via INTRAVENOUS

## 2016-04-15 NOTE — Transfer of Care (Signed)
Immediate Anesthesia Transfer of Care Note  Patient: Tina Walsh  Procedure(s) Performed: Procedure(s): REMOVAL FOREIGN BODY RIGHT HAND (Right)  Patient Location: PACU  Anesthesia Type:General  Level of Consciousness: sedated  Airway & Oxygen Therapy: Patient connected to face mask oxygen  Post-op Assessment: Report given to RN and Post -op Vital signs reviewed and stable  Post vital signs: Reviewed and stable  Last Vitals:  Filed Vitals:   04/14/16 1946 04/15/16 0016  BP: 116/60 115/59  Pulse: 60 74  Temp: 36.7 C 36.4 C  Resp: 18 15    Last Pain:  Filed Vitals:   04/15/16 0020  PainSc: 6          Complications: No apparent anesthesia complications

## 2016-04-15 NOTE — Op Note (Signed)
See note [phone]

## 2016-04-15 NOTE — Anesthesia Procedure Notes (Signed)
Date/Time: 04/15/2016 12:01 AM Performed by: Molli HazardGORDON, Christerpher Clos M Pre-anesthesia Checklist: Patient identified, Emergency Drugs available, Suction available and Patient being monitored Patient Re-evaluated:Patient Re-evaluated prior to inductionOxygen Delivery Method: Circle system utilized Preoxygenation: Pre-oxygenation with 100% oxygen Intubation Type: IV induction Ventilation: Mask ventilation throughout procedure

## 2016-04-15 NOTE — Op Note (Signed)
NAMLeanor Kail:  Z[place], [place]              ACCOUNT NO.:  [id number]  MEDICAL RECORD NO.:  [id number]  LOCATION:  [facility]                         FACILITY:  [facility]  PHYSICIAN:  Artist PaisMatthew A. Riven Mabile, M.D.DATE OF BIRTH:  1996-04-18  DATE OF PROCEDURE:  04/14/2016 DATE OF DISCHARGE:  04/15/2016                              OPERATIVE REPORT   PREOPERATIVE DIAGNOSIS:  Embedded foreign body, deep right hand.  POSTOPERATIVE DIAGNOSIS:  Embedded foreign body, deep right hand.  PROCEDURE:  Removal of foreign body, deep right hand.  SURGEON:  Artist PaisMatthew A. Mina MarbleWeingold, M.D.  ASSISTANT:  None.  ANESTHESIA:  General.  TOURNIQUET:  None.  COMPLICATIONS:  None.  DRAINS:  None.  DESCRIPTION OF PROCEDURE:  The patient was taken to the operating suite. After induction of adequate laryngeal mask general anesthetic, the right upper extremity was prepped and draped in usual sterile fashion.  At this point in time, we removed an embedded piece of chain-link fence in the hypothenar eminence of the right hand with gentle traction.  It was removed.  We then irrigated and scrubbed the wound with Betadine and dressed with 4x4s, Kerlix, and a 4 inch Ace.  The patient tolerated the procedure well and went to recovery room in stable fashion.     Artist PaisMatthew A. Mina MarbleWeingold, M.D.     MAW/MEDQ  D:  04/15/2016  T:  04/15/2016  Job:  [id number]

## 2016-04-15 NOTE — Anesthesia Postprocedure Evaluation (Signed)
Anesthesia Post Note  Patient: Nona DellJessica L Gaba  Procedure(s) Performed: Procedure(s) (LRB): REMOVAL FOREIGN BODY RIGHT HAND (Right)  Patient location during evaluation: PACU Anesthesia Type: General Level of consciousness: awake and alert Pain management: pain level controlled Vital Signs Assessment: post-procedure vital signs reviewed and stable Respiratory status: spontaneous breathing, nonlabored ventilation, respiratory function stable and patient connected to nasal cannula oxygen Cardiovascular status: blood pressure returned to baseline and stable Postop Assessment: no signs of nausea or vomiting Anesthetic complications: no    Last Vitals:  Filed Vitals:   04/15/16 0100 04/15/16 0115  BP: 113/69 104/60  Pulse: 73 62  Temp:  36.6 C  Resp: 17 14    Last Pain:  Filed Vitals:   04/15/16 0132  PainSc: 6                  Eniya Cannady,JAMES TERRILL

## 2016-04-20 DIAGNOSIS — S60551A Superficial foreign body of right hand, initial encounter: Secondary | ICD-10-CM | POA: Insufficient documentation

## 2016-04-20 DIAGNOSIS — S52611D Displaced fracture of right ulna styloid process, subsequent encounter for closed fracture with routine healing: Secondary | ICD-10-CM | POA: Insufficient documentation

## 2016-04-20 DIAGNOSIS — S52579D Other intraarticular fracture of lower end of unspecified radius, subsequent encounter for closed fracture with routine healing: Secondary | ICD-10-CM | POA: Insufficient documentation

## 2017-06-15 ENCOUNTER — Emergency Department
Admission: EM | Admit: 2017-06-15 | Discharge: 2017-06-15 | Disposition: A | Discharge status: Home / Self Care | Payer: Medicaid Other | Attending: Emergency Medicine | Admitting: Emergency Medicine

## 2017-06-15 ENCOUNTER — Encounter: Payer: Self-pay | Admitting: Emergency Medicine

## 2017-06-15 DIAGNOSIS — F17200 Nicotine dependence, unspecified, uncomplicated: Secondary | ICD-10-CM | POA: Insufficient documentation

## 2017-06-15 DIAGNOSIS — Y998 Other external cause status: Secondary | ICD-10-CM | POA: Insufficient documentation

## 2017-06-15 DIAGNOSIS — L03213 Periorbital cellulitis: Secondary | ICD-10-CM

## 2017-06-15 DIAGNOSIS — W57XXXA Bitten or stung by nonvenomous insect and other nonvenomous arthropods, initial encounter: Secondary | ICD-10-CM

## 2017-06-15 DIAGNOSIS — Y9389 Activity, other specified: Secondary | ICD-10-CM | POA: Insufficient documentation

## 2017-06-15 DIAGNOSIS — Y929 Unspecified place or not applicable: Secondary | ICD-10-CM | POA: Insufficient documentation

## 2017-06-15 DIAGNOSIS — S00262A Insect bite (nonvenomous) of left eyelid and periocular area, initial encounter: Secondary | ICD-10-CM | POA: Insufficient documentation

## 2017-06-15 MED ORDER — METHYLPREDNISOLONE SODIUM SUCC 125 MG IJ SOLR
125.0000 mg | Freq: Once | INTRAMUSCULAR | Status: AC
  Administered 2017-06-15: 125 mg via INTRAVENOUS
  Filled 2017-06-15: qty 2

## 2017-06-15 MED ORDER — CLINDAMYCIN HCL 300 MG PO CAPS: 300.0000 mg | ORAL_CAPSULE | Freq: Three times a day (TID) | ORAL | 0 refills | Status: AC

## 2017-06-15 MED ORDER — CLINDAMYCIN HCL 150 MG PO CAPS
300.0000 mg | ORAL_CAPSULE | Freq: Once | ORAL | Status: AC
  Administered 2017-06-15: 300 mg via ORAL
  Filled 2017-06-15: qty 2

## 2017-06-15 NOTE — ED Provider Notes (Signed)
[facility] Emergency Department Provider Note   ____________________________________________   First MD Initiated Contact with Patient 06/15/17 [phone]     (approximate)  I have reviewed the triage vital signs and the nursing notes.   HISTORY  Chief Complaint Insect Bite    HPI Tina Walsh is a 21 y.o. female who comes into the hospital today with some left eye swelling. The patient reports that she was stung on her left eye by a bee on Monday. She reports that there is some swelling around her eye. She went to bed and it seemed worse today. The patient took some Benadryl but decided to come to the hospital. She is itching around the eye but no other itching. The patient denies any shortness of breath or throat swelling. The patient has no hives or swelling in any other location. She did place a cold compress over her eye. She denies any blurred vision or pain. She has never had any allergic reaction to insect stings or bites before. The patient came into the hospital today for evaluation.   History reviewed. No pertinent past medical history.  There are no active problems to display for this patient.   Past Surgical History:  Procedure Laterality Date  . I&D EXTREMITY Right 04/14/2016   Procedure: REMOVAL FOREIGN BODY RIGHT HAND;  Surgeon: Dairl PonderMatthew Weingold, MD;  Location: [facility] OR;  Service: Orthopedics;  Laterality: Right;    Prior to Admission medications   Medication Sig Start Date End Date Taking? Authorizing Provider  clindamycin (CLEOCIN) 300 MG capsule Take 1 capsule (300 mg total) by mouth 3 (three) times daily. 06/15/17 06/25/17  Rebecka ApleyWebster, Allison P, MD  diphenhydrAMINE (BENADRYL) 25 mg capsule Take 25 mg by mouth every 6 (six) hours as needed for allergies.    [provider]  famotidine (PEPCID) 20 MG tablet Take 1 tablet (20 mg total) by mouth 2 (two) times daily. Patient taking differently: Take 20 mg by mouth 2 (two) times daily as  needed for heartburn.  03/21/16   Janne NapoleonNeese, Hope M, NP  loratadine (CLARITIN) 10 MG tablet Take 1 tablet (10 mg total) by mouth daily. Patient not taking: Reported on 04/14/2016 03/21/16   Janne NapoleonNeese, Hope M, NP  oxyCODONE-acetaminophen (ROXICET) 5-325 MG tablet Take 1 tablet by mouth every 4 (four) hours as needed for severe pain. 04/15/16   Dairl PonderWeingold, Matthew, MD  predniSONE (DELTASONE) 10 MG tablet Take 2 tablets (20 mg total) by mouth 2 (two) times daily with a meal. Patient not taking: Reported on 04/14/2016 03/21/16   Janne NapoleonNeese, Hope M, NP    Allergies Patient has no known allergies.  No family history on file.  Social History Social History  Substance Use Topics  . Smoking status: Light Tobacco Smoker  . Smokeless tobacco: Never Used  . Alcohol use No    Review of Systems  Constitutional: No fever/chills Eyes: Swelling to left eyelids ENT: No sore throat. Cardiovascular: Denies chest pain. Respiratory: Denies shortness of breath. Gastrointestinal: No abdominal pain.  No nausea, no vomiting.  No diarrhea.  No constipation. Genitourinary: Negative for dysuria. Musculoskeletal: Negative for back pain. Skin: Negative for rash. Neurological: Negative for headaches, focal weakness or numbness.   ____________________________________________   PHYSICAL EXAM:  VITAL SIGNS: ED Triage Vitals [06/15/17 0426]  Enc Vitals Group     BP (!) 148/89     Pulse Rate 79     Resp 18     Temp 98.2 F (36.8 C)  Temp Source Oral     SpO2 99 %     Weight 130 lb (59 kg)     Height 5\' 3"  (1.6 m)     Head Circumference      Peak Flow      Pain Score 2     Pain Loc      Pain Edu?      Excl. in GC?     Constitutional: Alert and oriented. Well appearing and in mild distress. Eyes: Conjunctivae are normal. PERRL. EOMI. soft tissue swelling of the upper and lower lid with some erythema no proptosis, no conjunctival injection, no pain with extraocular motion. Head: Atraumatic. Nose: No  congestion/rhinnorhea. Mouth/Throat: Mucous membranes are moist.  Oropharynx non-erythematous. Cardiovascular: Normal rate, regular rhythm. Grossly normal heart sounds.  Good peripheral circulation. Respiratory: Normal respiratory effort.  No retractions. Lungs CTAB. Gastrointestinal: Soft and nontender. No distention. Positive bowel sounds Musculoskeletal: No lower extremity tenderness nor edema.   Neurologic:  Normal speech and language.  Skin:  Skin is warm, dry and intact.  Psychiatric: Mood and affect are normal.   ____________________________________________   LABS (all labs ordered are listed, but only abnormal results are displayed)  Labs Reviewed - No data to display ____________________________________________  EKG  none ____________________________________________  RADIOLOGY  No results found.  ____________________________________________   PROCEDURES  Procedure(s) performed: None  Procedures  Critical Care performed: No  ____________________________________________   INITIAL IMPRESSION / ASSESSMENT AND PLAN / ED COURSE  Pertinent labs & imaging results that were available during my care of the patient were reviewed by me and considered in my medical decision making (see chart for details).  This is a 21 year old female who comes into the hospital today with some left eye swelling and redness after being stung in the eye. The patient did receive a dose of prednisone in the event that this was an allergic reaction but I am concerned for preseptal cellulitis. I'll give the patient is a clindamycin. The patient has no visual changes and she has no pain. I will discharge the patient to home with some antibiotics. I will have the patient follow up with her primary care physician for further evaluation.      ____________________________________________   FINAL CLINICAL IMPRESSION(S) / ED DIAGNOSES  Final diagnoses:  Insect bite, initial encounter    Preseptal cellulitis of left eye      NEW MEDICATIONS STARTED DURING THIS VISIT:  Discharge Medication List as of 06/15/2017  5:43 AM    START taking these medications   Details  clindamycin (CLEOCIN) 300 MG capsule Take 1 capsule (300 mg total) by mouth 3 (three) times daily., Starting Wed 06/15/2017, Until Sat 06/25/2017, Print         Note:  This document was prepared using Dragon voice recognition software and may include unintentional dictation errors.    Rebecka Apley, MD 06/15/17 [phone]

## 2017-06-15 NOTE — ED Triage Notes (Signed)
Patient ambulatory to triage with steady gait, without difficulty or distress noted; pt reports bee sting on Monday; now with swelling/itching to right eyelid

## 2017-06-15 NOTE — ED Notes (Signed)
Patient presents to the ED for a wasp sting to the left eye that occurred on Monday. She states [facility], hit a nest and one stung her. Felt immediate onset of pain. Did not swell until the next morning. PResents tonight with swelling to the entire left eye and into the inner canthus. Vision is not effected but states it has become itchy but is not painful. Patient also notes an area on the left jaw that she thinks could be poison ivy although does not itch. Patient denies allergy to bee stings and currently denies shortness of breath or chest pain. Took two benadryl this morning without resolution of swelling or redness and states the swelling is progressive.

## 2017-07-10 ENCOUNTER — Encounter (HOSPITAL_COMMUNITY): Payer: Self-pay | Admitting: Emergency Medicine

## 2017-07-10 ENCOUNTER — Emergency Department (HOSPITAL_COMMUNITY)
Admission: EM | Admit: 2017-07-10 | Discharge: 2017-07-10 | Disposition: A | Discharge status: Home / Self Care | Payer: Medicaid Other | Attending: Emergency Medicine | Admitting: Emergency Medicine

## 2017-07-10 DIAGNOSIS — L255 Unspecified contact dermatitis due to plants, except food: Secondary | ICD-10-CM

## 2017-07-10 DIAGNOSIS — L237 Allergic contact dermatitis due to plants, except food: Secondary | ICD-10-CM | POA: Insufficient documentation

## 2017-07-10 DIAGNOSIS — F17200 Nicotine dependence, unspecified, uncomplicated: Secondary | ICD-10-CM | POA: Insufficient documentation

## 2017-07-10 MED ORDER — DEXAMETHASONE SODIUM PHOSPHATE 10 MG/ML IJ SOLN
10.0000 mg | Freq: Once | INTRAMUSCULAR | Status: AC
  Administered 2017-07-10: 10 mg via INTRAMUSCULAR
  Filled 2017-07-10: qty 1

## 2017-07-10 MED ORDER — PREDNISONE 10 MG (21) PO TBPK: ORAL_TABLET | ORAL | 0 refills | Status: DC

## 2017-07-10 MED ORDER — HYDROXYZINE HCL 25 MG PO TABS: 25.0000 mg | ORAL_TABLET | Freq: Four times a day (QID) | ORAL | 0 refills | Status: DC

## 2017-07-10 MED ORDER — HYDROXYZINE HCL 25 MG PO TABS
25.0000 mg | ORAL_TABLET | Freq: Once | ORAL | Status: AC
Start: 2017-07-10 — End: 2017-07-10
  Administered 2017-07-10: 25 mg via ORAL
  Filled 2017-07-10: qty 1

## 2017-07-10 NOTE — ED Provider Notes (Signed)
[facility]-EMERGENCY DEPT Provider Note   CSN: [id number] Arrival date & time: 07/10/17  1345     History   Chief Complaint Chief Complaint  Patient presents with  . Rash    HPI Tina Walsh is a 21 y.o. female.  Pt presents to the ED today with poison ivy rash to face and to arm.  Pt said she mowed the lawn yesterday and developed the rash today.  She has taken benadryl and applied calamine lotion.        History reviewed. No pertinent past medical history.  There are no active problems to display for this patient.   Past Surgical History:  Procedure Laterality Date  . I&D EXTREMITY Right 04/14/2016   Procedure: REMOVAL FOREIGN BODY RIGHT HAND;  Surgeon: Dairl PonderMatthew Weingold, MD;  Location: [facility] OR;  Service: Orthopedics;  Laterality: Right;    OB History    No data available       Home Medications    Prior to Admission medications   Medication Sig Start Date End Date Taking? Authorizing Provider  diphenhydrAMINE (BENADRYL) 25 mg capsule Take 25 mg by mouth every 6 (six) hours as needed for allergies.    [provider]  famotidine (PEPCID) 20 MG tablet Take 1 tablet (20 mg total) by mouth 2 (two) times daily. Patient taking differently: Take 20 mg by mouth 2 (two) times daily as needed for heartburn.  03/21/16   Janne NapoleonNeese, Hope M, NP  hydrOXYzine (ATARAX/VISTARIL) 25 MG tablet Take 1 tablet (25 mg total) by mouth every 6 (six) hours. 07/10/17   Jacalyn LefevreHaviland, Jarone Ostergaard, MD  loratadine (CLARITIN) 10 MG tablet Take 1 tablet (10 mg total) by mouth daily. Patient not taking: Reported on 04/14/2016 03/21/16   Janne NapoleonNeese, Hope M, NP  oxyCODONE-acetaminophen (ROXICET) 5-325 MG tablet Take 1 tablet by mouth every 4 (four) hours as needed for severe pain. 04/15/16   Dairl PonderWeingold, Matthew, MD  predniSONE (STERAPRED UNI-PAK 21 TAB) 10 MG (21) TBPK tablet Take 6 tabs by mouth daily  for 2 days, then 5 tabs for 2 days, then 4 tabs for 2 days, then 3 tabs for 2 days, 2 tabs for 2 days, then 1 tab by  mouth daily for 2 days 07/10/17   Jacalyn LefevreHaviland, Amorah Sebring, MD    Family History No family history on file.  Social History Social History  Substance Use Topics  . Smoking status: Light Tobacco Smoker  . Smokeless tobacco: Never Used  . Alcohol use No     Allergies   Patient has no known allergies.   Review of Systems Review of Systems  Skin: Positive for rash.  All other systems reviewed and are negative.    Physical Exam Updated Vital Signs BP 112/71   Pulse 64   Temp 98.7 F (37.1 C)   Resp 16   Ht 5\' 3"  (1.6 m)   Wt 59 kg (130 lb)   SpO2 99%   BMI 23.03 kg/m   Physical Exam  Constitutional: She is oriented to person, place, and time. She appears well-developed and well-nourished.  HENT:  Head: Normocephalic and atraumatic.  Right Ear: External ear normal.  Left Ear: External ear normal.  Nose: Nose normal.  Mouth/Throat: Oropharynx is clear and moist.  Eyes: Pupils are equal, round, and reactive to light. Conjunctivae and EOM are normal.  Neck: Normal range of motion. Neck supple.  Cardiovascular: Normal rate, regular rhythm, normal heart sounds and intact distal pulses.   Pulmonary/Chest: Effort normal and breath sounds normal.  Abdominal: Soft. Bowel sounds are normal.  Musculoskeletal: Normal range of motion.  Neurological: She is alert and oriented to person, place, and time.  Skin:  Typical rhus dermatitis to left face, left eye lid, left outer ear, left neck, right arm, upper chest. No evidence of eye involvement.  Nursing note and vitals reviewed.    ED Treatments / Results  Labs (all labs ordered are listed, but only abnormal results are displayed) Labs Reviewed - No data to display  EKG  EKG Interpretation None       Radiology No results found.  Procedures Procedures (including critical care time)  Medications Ordered in ED Medications  dexamethasone (DECADRON) injection 10 mg (not administered)  hydrOXYzine (ATARAX/VISTARIL) tablet  25 mg (not administered)     Initial Impression / Assessment and Plan / ED Course  I have reviewed the triage vital signs and the nursing notes.  Pertinent labs & imaging results that were available during my care of the patient were reviewed by me and considered in my medical decision making (see chart for details).     Exam c/w rhus dermatitis.  She will be given atarax and decadron prior to d/c.  She is given rx for prednisone and atarax.  Return if worse and f/u with pcp.  Final Clinical Impressions(s) / ED Diagnoses   Final diagnoses:  Rhus dermatitis    New Prescriptions New Prescriptions   HYDROXYZINE (ATARAX/VISTARIL) 25 MG TABLET    Take 1 tablet (25 mg total) by mouth every 6 (six) hours.   PREDNISONE (STERAPRED UNI-PAK 21 TAB) 10 MG (21) TBPK TABLET    Take 6 tabs by mouth daily  for 2 days, then 5 tabs for 2 days, then 4 tabs for 2 days, then 3 tabs for 2 days, 2 tabs for 2 days, then 1 tab by mouth daily for 2 days     Jacalyn Lefevre, MD 07/10/17 1459

## 2017-07-10 NOTE — ED Triage Notes (Signed)
Pt states "ive got poison ivy in my left eye and in my left ear". Pt left eye has no abnormal appearance with no drainage. No redness noted.

## 2018-05-09 ENCOUNTER — Ambulatory Visit: Payer: Self-pay | Admitting: Family Medicine

## 2018-05-09 ENCOUNTER — Encounter: Payer: Self-pay | Admitting: Family Medicine

## 2018-05-09 VITALS — BP 116/72 | HR 69 | Temp 98.2°F | Resp 17 | Ht 63.0 in | Wt 144.0 lb

## 2018-05-09 DIAGNOSIS — Z789 Other specified health status: Secondary | ICD-10-CM

## 2018-05-09 DIAGNOSIS — K219 Gastro-esophageal reflux disease without esophagitis: Secondary | ICD-10-CM

## 2018-05-09 DIAGNOSIS — F332 Major depressive disorder, recurrent severe without psychotic features: Secondary | ICD-10-CM

## 2018-05-09 DIAGNOSIS — M533 Sacrococcygeal disorders, not elsewhere classified: Secondary | ICD-10-CM

## 2018-05-09 DIAGNOSIS — L700 Acne vulgaris: Secondary | ICD-10-CM

## 2018-05-09 DIAGNOSIS — F640 Transsexualism: Secondary | ICD-10-CM

## 2018-05-09 MED ORDER — "NEEDLE (DISP) 18G X 1-1/2"" MISC": 1.0000 [IU] | 4 refills | Status: DC

## 2018-05-09 MED ORDER — OMEPRAZOLE 40 MG PO CPDR: 40.0000 mg | DELAYED_RELEASE_CAPSULE | Freq: Every day | ORAL | 3 refills | Status: DC

## 2018-05-09 MED ORDER — DOXYCYCLINE HYCLATE 100 MG PO TABS: 100.0000 mg | ORAL_TABLET | Freq: Every day | ORAL | 5 refills | Status: DC

## 2018-05-09 MED ORDER — SYRINGE (DISPOSABLE) 1 ML MISC: 1.0000 [IU] | 4 refills | Status: DC

## 2018-05-09 MED ORDER — "NEEDLE (DISP) 23G X 1"" MISC": 1.0000 [IU] | 4 refills | Status: DC

## 2018-05-09 MED ORDER — CITALOPRAM HYDROBROMIDE 20 MG PO TABS: ORAL_TABLET | ORAL | 5 refills | Status: DC

## 2018-05-09 MED ORDER — TESTOSTERONE CYPIONATE 200 MG/ML IM SOLN: 80.0000 mg | INTRAMUSCULAR | 0 refills | Status: DC

## 2018-05-09 NOTE — Patient Instructions (Addendum)
Download the goodrx app or go to goodrx.com and type in your zipcode to get discounts for medication.   Forinstance - if you purchse a "membership" to [facility] (usually only ~$10-15), then you can get the omeprazole there for $3/mo. Or with goodrx coupon omeprazole is $8.43 at [facility] (you do NOT need a membership - just say at the entrance that you are going to the pharmacy.) Or check out independent pharmacies tailored for self-pay patients so they sell drugs at cost with just a small processing/handling fee and mail them to you like "[facility]" [place] or [facility].  Doxycycline is $14 at [facility] and [facility] with good rx.  Citalopram is $3 with [facility].  On goodrx you can get a coupon to get a discount on testosterone to get the 10mL vial for $40 at [facility]  I recommend coming in to have your blood drawn in a lab-only visit ~4-6 days prior to our next scheduled office visit so that the results are available for us to review together and discuss any implications during your visit.  There is no payment or charge for the visit - just the same lab cost you will have to have at the initial recheck anyway. After that, we can do labs less and try to keep to twice a year but depends how your symptoms, dose, and changes go.  Try to always time blood draws IF possible (not mandatory) to be at the half-way point between injections which would typically mean ideally having labs drawn 3-4days after your last injection.   Living With Depression Everyone experiences occasional disappointment, sadness, and loss in their lives. When you are feeling down, blue, or sad for at least 2 weeks in a row, it may mean that you have depression. Depression can affect your thoughts and feelings, relationships, daily activities, and physical health. It is caused by changes in the way your brain functions. If you receive a diagnosis of depression, your health care  provider will tell you which type of depression you have and what treatment options are available to you. If you are living with depression, there are ways to help you recover from it and also ways to prevent it from coming back. How to cope with lifestyle changes Coping with stress Stress is your body's reaction to life changes and events, both good and bad. Stressful situations may include:  Getting married.  The death of a spouse.  Losing a job.  Retiring.  Having a baby.  Stress can last just a few hours or it can be ongoing. Stress can play a major role in depression, so it is important to learn both how to cope with stress and how to think about it differently. Talk with your health care provider or a counselor if you would like to learn more about stress reduction. He or she may suggest some stress reduction techniques, such as:  Music therapy. This can include creating music or listening to music. Choose music that you enjoy and that inspires you.  Mindfulness-based meditation. This kind of meditation can be done while sitting or walking. It involves being aware of your normal breaths, rather than trying to control your breathing.  Centering prayer. This is a kind of meditation that involves focusing on a spiritual word or phrase. Choose a word, phrase, or sacred image that is meaningful to you and that brings you peace.  Deep breathing. To do this, expand your stomach and inhale  slowly through your nose. Hold your breath for 3-5 seconds, then exhale slowly, allowing your stomach muscles to relax.  Muscle relaxation. This involves intentionally tensing muscles then relaxing them.  Choose a stress reduction technique that fits your lifestyle and personality. Stress reduction techniques take time and practice to develop. Set aside 5-15 minutes a day to do them. Therapists can offer training in these techniques. The training may be covered by some insurance plans. Other things you  can do to manage stress include:  Keeping a stress diary. This can help you learn what triggers your stress and ways to control your response.  Understanding what your limits are and saying no to requests or events that lead to a schedule that is too full.  Thinking about how you respond to certain situations. You may not be able to control everything, but you can control how you react.  Adding humor to your life by watching funny films or TV shows.  Making time for activities that help you relax and not feeling guilty about spending your time this way.  Medicines Your health care provider may suggest certain medicines if he or she feels that they will help improve your condition. Avoid using alcohol and other substances that may prevent your medicines from working properly (may interact). It is also important to:  Talk with your pharmacist or health care provider about all the medicines that you take, their possible side effects, and what medicines are safe to take together.  Make it your goal to take part in all treatment decisions (shared decision-making). This includes giving input on the side effects of medicines. It is best if shared decision-making with your health care provider is part of your total treatment plan.  If your health care provider prescribes a medicine, you may not notice the full benefits of it for 4-8 weeks. Most people who are treated for depression need to be on medicine for at least 6-12 months after they feel better. If you are taking medicines as part of your treatment, do not stop taking medicines without first talking to your health care provider. You may need to have the medicine slowly decreased (tapered) over time to decrease the risk of harmful side effects. Relationships Your health care provider may suggest family therapy along with individual therapy and drug therapy. While there may not be family problems that are causing you to feel depressed, it is still  important to make sure your family learns as much as they can about your mental health. Having your family's support can help make your treatment successful. How to recognize changes in your condition Everyone has a different response to treatment for depression. Recovery from major depression happens when you have not had signs of major depression for two months. This may mean that you will start to:  Have more interest in doing activities.  Feel less hopeless than you did 2 months ago.  Have more energy.  Overeat less often, or have better or improving appetite.  Have better concentration.  Your health care provider will work with you to decide the next steps in your recovery. It is also important to recognize when your condition is getting worse. Watch for these signs:  Having fatigue or low energy.  Eating too much or too little.  Sleeping too much or too little.  Feeling restless, agitated, or hopeless.  Having trouble concentrating or making decisions.  Having unexplained physical complaints.  Feeling irritable, angry, or aggressive.  Get help as soon  as you or your family members notice these symptoms coming back. How to get support and help from others How to talk with friends and family members about your condition Talking to friends and family members about your condition can provide you with one way to get support and guidance. Reach out to trusted friends or family members, explain your symptoms to them, and let them know that you are working with a health care provider to treat your depression. Financial resources Not all insurance plans cover mental health care, so it is important to check with your insurance carrier. If paying for co-pays or counseling services is a problem, search for a local or county mental health care center. They may be able to offer public mental health care services at low or no cost when you are not able to see a private health care  provider. If you are taking medicine for depression, you may be able to get the generic form, which may be less expensive. Some makers of prescription medicines also offer help to patients who cannot afford the medicines they need. Follow these instructions at home:  Get the right amount and quality of sleep.  Cut down on using caffeine, tobacco, alcohol, and other potentially harmful substances.  Try to exercise, such as walking or lifting small weights.  Take over-the-counter and prescription medicines only as told by your health care provider.  Eat a healthy diet that includes plenty of vegetables, fruits, whole grains, low-fat dairy products, and lean protein. Do not eat a lot of foods that are high in solid fats, added sugars, or salt.  Keep all follow-up visits as told by your health care provider. This is important. Contact a health care provider if:  You stop taking your antidepressant medicines, and you have any of these symptoms: ? Nausea. ? Headache. ? Feeling lightheaded. ? Chills and body aches. ? Not being able to sleep (insomnia).  You or your friends and family think your depression is getting worse. Get help right away if:  You have thoughts of hurting yourself or others. If you ever feel like you may hurt yourself or others, or have thoughts about taking your own life, get help right away. You can go to your nearest emergency department or call:  Your local emergency services (911 in the [place]  A suicide crisis helpline, such as the National Suicide Prevention Lifeline at 219 579 9434. This is open 24-hours a day.  Summary  If you are living with depression, there are ways to help you recover from it and also ways to prevent it from coming back.  Work with your health care team to create a management plan that includes counseling, stress management techniques, and healthy lifestyle habits. This information is not intended to replace advice given to you by  your health care provider. Make sure you discuss any questions you have with your health care provider. Document Released: 10/18/2016 Document Revised: 10/18/2016 Document Reviewed: 10/18/2016 Elsevier Interactive Patient Education  [employer].
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
## 2018-09-07 DIAGNOSIS — K219 Gastro-esophageal reflux disease without esophagitis: Secondary | ICD-10-CM | POA: Insufficient documentation

## 2018-09-07 DIAGNOSIS — L700 Acne vulgaris: Secondary | ICD-10-CM | POA: Insufficient documentation

## 2018-09-07 DIAGNOSIS — Z789 Other specified health status: Secondary | ICD-10-CM | POA: Insufficient documentation

## 2018-09-07 DIAGNOSIS — F332 Major depressive disorder, recurrent severe without psychotic features: Secondary | ICD-10-CM | POA: Insufficient documentation

## 2018-09-07 DIAGNOSIS — F640 Transsexualism: Secondary | ICD-10-CM | POA: Insufficient documentation

## 2018-09-07 DIAGNOSIS — M533 Sacrococcygeal disorders, not elsewhere classified: Secondary | ICD-10-CM | POA: Insufficient documentation

## 2018-10-23 ENCOUNTER — Telehealth: Payer: Self-pay | Admitting: Family Medicine

## 2018-10-23 NOTE — Telephone Encounter (Signed)
Copied from CRM [phone]. Topic: Quick Communication - See Telephone Encounter >> Oct 23, 2018  1:54 PM Arlyss Gandyichardson, Anona Giovannini N, NT wrote: CRM for notification. See Telephone encounter for: 10/23/18. Pt is wanting to see if he can get the Testerone refilled without an appt due to he does not have the money to pay for an OV. Please advise.

## 2021-05-21 ENCOUNTER — Other Ambulatory Visit: Payer: Self-pay

## 2021-05-21 ENCOUNTER — Emergency Department (HOSPITAL_COMMUNITY)
Admission: EM | Admit: 2021-05-21 | Discharge: 2021-05-21 | Disposition: A | Discharge status: Home / Self Care | Payer: Self-pay | Attending: Emergency Medicine | Admitting: Emergency Medicine

## 2021-05-21 ENCOUNTER — Encounter (HOSPITAL_COMMUNITY): Payer: Self-pay | Admitting: Emergency Medicine

## 2021-05-21 DIAGNOSIS — L255 Unspecified contact dermatitis due to plants, except food: Secondary | ICD-10-CM | POA: Insufficient documentation

## 2021-05-21 DIAGNOSIS — L237 Allergic contact dermatitis due to plants, except food: Secondary | ICD-10-CM

## 2021-05-21 MED ORDER — PREDNISONE 20 MG PO TABS: ORAL_TABLET | ORAL | 0 refills | Status: DC

## 2021-05-21 MED ORDER — CETIRIZINE HCL 10 MG PO TABS: 10.0000 mg | ORAL_TABLET | Freq: Every day | ORAL | 0 refills | Status: DC

## 2021-05-21 NOTE — ED Triage Notes (Signed)
Patient arrives complaining of poison ivy that began Monday after doing yard work. Patient states that his poison ivy tends to travel to his eyes and he wanted to be seen before this occurred. Patient has open sores on body and knuckles that are draining.

## 2021-05-21 NOTE — ED Notes (Signed)
An After Visit Summary was printed and given to the patient. Discharge instructions given and no further questions at this time.  

## 2021-05-21 NOTE — ED Provider Notes (Signed)
[facility]-EMERGENCY DEPT Provider Note   CSN: [id number] Arrival date & time: 05/21/21  2016     History Chief Complaint  Patient presents with   Poison Ivy    Tina Walsh is a 25 y.o. adult.  The history is provided by the patient. No language interpreter was used.  Poison [name]   25 year old female to female transgender who presents with complaints of a rash.  Patient report he was exposed to poison ivy while doing yard work 4 days ago.  Rash involving his face, and both hands.  It is itchy, moderate in intensity oozing some fluid.  He has been using calamine lotion and Benadryl at home but states Benadryl makes him drowsy.  He has had poison ivy in the past that affects his eyes and he was concerned about that.  However at this time he denies any eye irritation.  No fever chills chest pain trouble breathing or throat discomfort.  History reviewed. No pertinent past medical history.  Patient Active Problem List   Diagnosis Date Noted   Acne nodule 09/07/2018   Severe episode of recurrent major depressive disorder, without psychotic features (HCC) 09/07/2018   Female-to-female transgender person 09/07/2018   Sacral back pain 09/07/2018   Gastroesophageal reflux disease 09/07/2018    Past Surgical History:  Procedure Laterality Date   I & D EXTREMITY Right 04/14/2016   Procedure: REMOVAL FOREIGN BODY RIGHT HAND;  Surgeon: Dairl Ponder, MD;  Location: [facility] OR;  Service: Orthopedics;  Laterality: Right;     OB History   No obstetric history on file.     No family history on file.  Social History   Tobacco Use   Smoking status: Never   Smokeless tobacco: Never  Substance Use Topics   Alcohol use: No   Drug use: Not Currently    Home Medications Prior to Admission medications   Medication Sig Start Date End Date Taking? Authorizing Provider  citalopram (CELEXA) 20 MG tablet Take 1/2 tab po qd x 2 weeks, then 1 tab qd 05/09/18   Sherren Mocha,  MD  doxycycline (VIBRA-TABS) 100 MG tablet Take 1 tablet (100 mg total) by mouth daily. 05/09/18   Sherren Mocha, MD  NEEDLE, DISP, 18 G 18G X 1-1/2" MISC 1 Units by Does not apply route once a week. 05/09/18   Sherren Mocha, MD  NEEDLE, DISP, 23 G 23G X 1" MISC 1 Units by Does not apply route once a week. 05/09/18   Sherren Mocha, MD  omeprazole (PRILOSEC) 40 MG capsule Take 1 capsule (40 mg total) by mouth daily. 05/09/18   Sherren Mocha, MD  Syringe, Disposable, 1 ML MISC 1 Units by Does not apply route once a week. 05/09/18   Sherren Mocha, MD  testosterone cypionate (DEPOTESTOSTERONE CYPIONATE) 200 MG/ML injection Inject 0.4 mLs (80 mg total) into the muscle once a week. 05/09/18   Sherren Mocha, MD    Allergies    Patient has no known allergies.  Review of Systems   Review of Systems  All other systems reviewed and are negative.  Physical Exam Updated Vital Signs BP 134/82 (BP Location: Right Arm)   Pulse 71   Temp 98.5 F (36.9 C) (Oral)   Resp 16   Ht 5\' 3"  (1.6 m)   Wt 79.4 kg   SpO2 100%   BMI 31.00 kg/m   Physical Exam Constitutional:      General: He is not in  acute distress.    Appearance: He is well-developed.  HENT:     Head: Atraumatic.  Eyes:     Conjunctiva/sclera: Conjunctivae normal.  Musculoskeletal:     Cervical back: Normal range of motion and neck supple.  Skin:    Findings: Rash (Multiple scattered vesicular lesion noted to face around the right side of the chin and cheek without ocular involvement.  Similar vesicular rash noted to dorsum of hands bilaterally) present.  Neurological:     Mental Status: He is alert.    ED Results / Procedures / Treatments   Labs (all labs ordered are listed, but only abnormal results are displayed) Labs Reviewed - No data to display  EKG None  Radiology No results found.  Procedures Procedures   Medications Ordered in ED Medications - No data to display  ED Course  I have reviewed the triage vital signs and the  nursing notes.  Pertinent labs & imaging results that were available during my care of the patient were reviewed by me and considered in my medical decision making (see chart for details).    MDM Rules/Calculators/A&P                          BP 134/82 (BP Location: Right Arm)   Pulse 71   Temp 98.5 F (36.9 C) (Oral)   Resp 16   Ht 5\' 3"  (1.6 m)   Wt 79.4 kg   SpO2 100%   BMI 31.00 kg/m   Final Clinical Impression(s) / ED Diagnoses Final diagnoses:  Poison ivy dermatitis    Rx / DC Orders ED Discharge Orders     None      8:54 PM Patient here with itchy rash after doing yard work.  Rash consistent with contact dermatitis likely from poison ivy.  No systemic involvement.  It is reasonable to initiate extended course of prednisone and Zyrtec as treatment.   , PA-C 05/21/21 2100    [place], [id number], DO 05/21/21 2221

## 2021-09-23 ENCOUNTER — Encounter: Payer: Self-pay | Admitting: Emergency Medicine

## 2021-09-23 ENCOUNTER — Ambulatory Visit
Admission: EM | Admit: 2021-09-23 | Discharge: 2021-09-23 | Disposition: A | Discharge status: Home / Self Care | Payer: Medicaid Other | Attending: Internal Medicine | Admitting: Internal Medicine

## 2021-09-23 ENCOUNTER — Other Ambulatory Visit: Payer: Self-pay

## 2021-09-23 DIAGNOSIS — H65193 Other acute nonsuppurative otitis media, bilateral: Secondary | ICD-10-CM | POA: Insufficient documentation

## 2021-09-23 DIAGNOSIS — J039 Acute tonsillitis, unspecified: Secondary | ICD-10-CM | POA: Insufficient documentation

## 2021-09-23 DIAGNOSIS — Z20822 Contact with and (suspected) exposure to covid-19: Secondary | ICD-10-CM | POA: Insufficient documentation

## 2021-09-23 DIAGNOSIS — J029 Acute pharyngitis, unspecified: Secondary | ICD-10-CM | POA: Insufficient documentation

## 2021-09-23 LAB — POCT RAPID STREP A (OFFICE): Rapid Strep A Screen: NEGATIVE

## 2021-09-23 MED ORDER — AMOXICILLIN 500 MG PO CAPS: 500.0000 mg | ORAL_CAPSULE | Freq: Two times a day (BID) | ORAL | 0 refills | Status: AC

## 2021-09-23 MED ORDER — FLUTICASONE PROPIONATE 50 MCG/ACT NA SUSP: 1.0000 | Freq: Every day | NASAL | 0 refills | Status: AC

## 2021-09-23 MED ORDER — CETIRIZINE HCL 10 MG PO TABS: 10.0000 mg | ORAL_TABLET | Freq: Every day | ORAL | 0 refills | Status: AC

## 2021-09-23 NOTE — ED Provider Notes (Signed)
[name]    CSN: [id number] Arrival date & time: 09/23/21  5631      History   Chief Complaint Chief Complaint  Patient presents with   Sore Throat    HPI Tina Walsh is a 25 y.o. adult.   Patient presents with 2-day history of sore throat, cough, bilateral ear pain.  Cough is productive with white sputum per patient.  Denies any fevers or known sick contacts.  Denies chest pain or shortness of breath.  Patient has not yet taken any medications to help alleviate symptoms.  Denies any stuffy nose or upper respiratory symptoms.   Sore Throat   History reviewed. No pertinent past medical history.  Patient Active Problem List   Diagnosis Date Noted   Acne nodule 09/07/2018   Severe episode of recurrent major depressive disorder, without psychotic features (HCC) 09/07/2018   Female-to-female transgender person 09/07/2018   Sacral back pain 09/07/2018   Gastroesophageal reflux disease 09/07/2018    Past Surgical History:  Procedure Laterality Date   I & D EXTREMITY Right 04/14/2016   Procedure: REMOVAL FOREIGN BODY RIGHT HAND;  Surgeon: Dairl Ponder, MD;  Location: [facility] OR;  Service: Orthopedics;  Laterality: Right;    OB History   No obstetric history on file.      Home Medications    Prior to Admission medications   Medication Sig Start Date End Date Taking? Authorizing Provider  amoxicillin (AMOXIL) 500 MG capsule Take 1 capsule (500 mg total) by mouth 2 (two) times daily for 10 days. 09/23/21 10/03/21 Yes Joshalyn Ancheta, Acie Fredrickson, FNP  cetirizine (ZYRTEC) 10 MG tablet Take 1 tablet (10 mg total) by mouth daily for 10 days. 09/23/21 10/03/21 Yes Glenis Musolf, Acie Fredrickson, FNP  fluticasone (FLONASE) 50 MCG/ACT nasal spray Place 1 spray into both nostrils daily for 3 days. 09/23/21 09/26/21 Yes Jaycion Treml, Acie Fredrickson, FNP  citalopram (CELEXA) 20 MG tablet Take 1/2 tab po qd x 2 weeks, then 1 tab qd 05/09/18   Sherren Mocha, MD  doxycycline (VIBRA-TABS) 100 MG tablet Take 1  tablet (100 mg total) by mouth daily. Patient not taking: Reported on 09/23/2021 05/09/18   Sherren Mocha, MD  NEEDLE, DISP, 18 G 18G X 1-1/2" MISC 1 Units by Does not apply route once a week. 05/09/18   Sherren Mocha, MD  NEEDLE, DISP, 23 G 23G X 1" MISC 1 Units by Does not apply route once a week. 05/09/18   Sherren Mocha, MD  omeprazole (PRILOSEC) 40 MG capsule Take 1 capsule (40 mg total) by mouth daily. Patient not taking: Reported on 09/23/2021 05/09/18   Sherren Mocha, MD  predniSONE (DELTASONE) 20 MG tablet 3 Tabs PO Days 1-3, then 2 tabs PO Days 4-6, then 1 tab PO Day 7-9, then Half Tab PO Day 10-12 Patient not taking: Reported on 09/23/2021 05/21/21   Fayrene Helper, PA-C  Syringe, Disposable, 1 ML MISC 1 Units by Does not apply route once a week. 05/09/18   Sherren Mocha, MD  testosterone cypionate (DEPOTESTOSTERONE CYPIONATE) 200 MG/ML injection Inject 0.4 mLs (80 mg total) into the muscle once a week. 05/09/18   Sherren Mocha, MD    Family History Family History  Problem Relation Age of Onset   Hypertension Mother    Hypertension Father     Social History Social History   Tobacco Use   Smoking status: Never   Smokeless tobacco: Never  Vaping Use   Vaping Use: Never used  Substance Use Topics   Alcohol use: Yes    Comment: rare   Drug use: Not Currently     Allergies   Patient has no known allergies.   Review of Systems Review of Systems Per HPI  Physical Exam Triage Vital Signs ED Triage Vitals  Enc Vitals Group     BP 09/23/21 1159 (!) 144/73     Pulse Rate 09/23/21 1159 77     Resp 09/23/21 1159 20     Temp 09/23/21 1159 98.4 F (36.9 C)     Temp Source 09/23/21 1159 Oral     SpO2 09/23/21 1159 98 %     Weight --      Height --      Head Circumference --      Peak Flow --      Pain Score 09/23/21 1154 3     Pain Loc --      Pain Edu? --      Excl. in GC? --    No data found.  Updated Vital Signs BP (!) 144/73 (BP Location: Left Arm)   Pulse 77   Temp 98.4  F (36.9 C) (Oral)   Resp 20   SpO2 98%   Visual Acuity Right Eye Distance:   Left Eye Distance:   Bilateral Distance:    Right Eye Near:   Left Eye Near:    Bilateral Near:     Physical Exam Constitutional:      General: He is not in acute distress.    Appearance: Normal appearance. He is not toxic-appearing or diaphoretic.  HENT:     Head: Normocephalic and atraumatic.     Right Ear: Ear canal normal. No drainage, swelling or tenderness. A middle ear effusion is present. Tympanic membrane is not perforated, erythematous or bulging.     Left Ear: Ear canal normal. No drainage, swelling or tenderness. A middle ear effusion is present. Tympanic membrane is not perforated, erythematous or bulging.     Nose: Congestion present.     Mouth/Throat:     Lips: Pink.     Mouth: Mucous membranes are moist.     Pharynx: Posterior oropharyngeal erythema present. No pharyngeal swelling or oropharyngeal exudate.     Tonsils: No tonsillar exudate or tonsillar abscesses. 1+ on the right. 1+ on the left.     Comments: Tonsil stone present to left tonsil. Eyes:     Extraocular Movements: Extraocular movements intact.     Conjunctiva/sclera: Conjunctivae normal.     Pupils: Pupils are equal, round, and reactive to light.  Cardiovascular:     Rate and Rhythm: Normal rate and regular rhythm.     Pulses: Normal pulses.     Heart sounds: Normal heart sounds.  Pulmonary:     Effort: Pulmonary effort is normal. No respiratory distress.     Breath sounds: Normal breath sounds. No wheezing.  Abdominal:     General: Abdomen is flat. Bowel sounds are normal.     Palpations: Abdomen is soft.  Musculoskeletal:        General: Normal range of motion.     Cervical back: Normal range of motion.  Skin:    General: Skin is warm and dry.  Neurological:     General: No focal deficit present.     Mental Status: He is alert and oriented to person, place, and time. Mental status is at baseline.   Psychiatric:        Mood and Affect: Mood normal.  Behavior: Behavior normal.     UC Treatments / Results  Labs (all labs ordered are listed, but only abnormal results are displayed) Labs Reviewed  CULTURE, GROUP A STREP ([facility])  NOVEL CORONAVIRUS, NAA  POCT RAPID STREP A (OFFICE)    EKG   Radiology No results found.  Procedures Procedures (including critical care time)  Medications Ordered in UC Medications - No data to display  Initial Impression / Assessment and Plan / UC Course  I have reviewed the triage vital signs and the nursing notes.  Pertinent labs & imaging results that were available during my care of the patient were reviewed by me and considered in my medical decision making (see chart for details).     Rapid strep test was negative but suspicious of strep throat given appearance of posterior pharynx and acute tonsillitis on exam.  Will treat with amoxicillin x10 days.  Throat culture and COVID-19 viral swab are pending.  Warm salt water gargles for tonsil stones.  No signs of peritonsillar abscess.  Discussed supportive care with patient.Discussed strict return precautions. Patient verbalized understanding and is agreeable with plan.  Final Clinical Impressions(s) / UC Diagnoses   Final diagnoses:  Sore throat  Encounter for laboratory testing for COVID-19 virus  Acute tonsillitis, unspecified etiology  Acute MEE (middle ear effusion), bilateral     Discharge Instructions      Rapid strep test was negative but still suspicious of strep throat given appearance of your throat on exam.  Throat culture and COVID-19 viral swab are pending.  We will call if they are positive.  You have been prescribed amoxicillin antibiotic, cetirizine, Flonase to help alleviate your symptoms.     ED Prescriptions     Medication Sig Dispense Auth. Provider   cetirizine (ZYRTEC) 10 MG tablet Take 1 tablet (10 mg total) by mouth daily for 10 days. 10 tablet  [place], [place], [place]   fluticasone [facility]) 50 MCG/ACT nasal spray Place 1 spray into both nostrils daily for 3 days. 16 g [place], [place]olly [place] E, [place]   amoxicillin (AMOXIL) 500 MG capsule Take 1 capsule (500 mg total) by mouth 2 (two) times daily for 10 days. 20 capsule Gustavus Bryant, [place]      PDMP not reviewed this encounter.   Gustavus Bryant, [place] 09/23/21 1314

## 2021-09-23 NOTE — ED Triage Notes (Signed)
Sore throat and bilateral ear pain.  Patient says tonsillar stones have been noticed for 2 weeks, sore throat started yesterday

## 2021-09-23 NOTE — Discharge Instructions (Addendum)
Rapid strep test was negative but still suspicious of strep throat given appearance of your throat on exam.  Throat culture and COVID-19 viral swab are pending.  We will call if they are positive.  You have been prescribed amoxicillin antibiotic, cetirizine, Flonase to help alleviate your symptoms.

## 2021-09-24 LAB — NOVEL CORONAVIRUS, NAA: SARS-CoV-2, NAA: NOT DETECTED

## 2021-09-24 LAB — SARS-COV-2, NAA 2 DAY TAT

## 2021-09-25 LAB — CULTURE, GROUP A STREP (THRC)

## 2022-02-03 DIAGNOSIS — J358 Other chronic diseases of tonsils and adenoids: Secondary | ICD-10-CM | POA: Insufficient documentation

## 2022-04-05 ENCOUNTER — Ambulatory Visit
Admission: RE | Admit: 2022-04-05 | Discharge: 2022-04-05 | Disposition: A | Discharge status: Home / Self Care | Payer: 59 | Source: Ambulatory Visit | Attending: Physician Assistant | Admitting: Physician Assistant

## 2022-04-05 ENCOUNTER — Ambulatory Visit (INDEPENDENT_AMBULATORY_CARE_PROVIDER_SITE_OTHER): Payer: 59

## 2022-04-05 VITALS — BP 117/75 | HR 63 | Temp 97.9°F | Resp 18

## 2022-04-05 DIAGNOSIS — M778 Other enthesopathies, not elsewhere classified: Secondary | ICD-10-CM | POA: Diagnosis not present

## 2022-04-05 DIAGNOSIS — M25531 Pain in right wrist: Secondary | ICD-10-CM

## 2022-04-05 NOTE — ED Triage Notes (Signed)
Patient presents to Urgent Care with complaints of 6/10 r wrist pain on thumb side  since  about week ago. Patient reports no otc medications but has been wearing the brace at work.  ? ?

## 2022-04-05 NOTE — ED Provider Notes (Signed)
?[name] ? ? ? ?CSN: [id number] ?Arrival date & time: 04/05/22  1302 ? ? ?  ? ?History   ?Chief Complaint ?Chief Complaint  ?Patient presents with  ? Wrist Pain  ?  Entered by patient  ? ? ?HPI ?Tina Walsh is a 26 y.o. adult.  ? ?Pt complains of pain in the right wrist. Pt lifts heavy tires.  No known injury  ? ?The history is provided by the patient. No language interpreter was used.  ?Wrist Pain ?This is a new problem. The problem occurs constantly. Nothing aggravates the symptoms. Nothing relieves the symptoms. He has tried nothing for the symptoms. The treatment provided no relief.  ? ?History reviewed. No pertinent past medical history. ? ?Patient Active Problem List  ? Diagnosis Date Noted  ? Acne nodule 09/07/2018  ? Severe episode of recurrent major depressive disorder, without psychotic features (HCC) 09/07/2018  ? Female-to-female transgender person 09/07/2018  ? Sacral back pain 09/07/2018  ? Gastroesophageal reflux disease 09/07/2018  ? ? ?Past Surgical History:  ?Procedure Laterality Date  ? I & D EXTREMITY Right 04/14/2016  ? Procedure: REMOVAL FOREIGN BODY RIGHT HAND;  Surgeon: Dairl Ponder, MD;  Location: [facility];  Service: Orthopedics;  Laterality: Right;  ? ? ?OB History   ?No obstetric history on file. ?  ? ? ? ?Home Medications   ? ?Prior to Admission medications   ?Medication Sig Start Date End Date Taking? Authorizing Provider  ?cetirizine (ZYRTEC) 10 MG tablet Take 1 tablet (10 mg total) by mouth daily for 10 days. 09/23/21 10/03/21  Gustavus Bryant, FNP  ?citalopram (CELEXA) 20 MG tablet Take 1/2 tab po qd x 2 weeks, then 1 tab qd 05/09/18   Sherren Mocha, MD  ?doxycycline (VIBRA-TABS) 100 MG tablet Take 1 tablet (100 mg total) by mouth daily. ?Patient not taking: Reported on 09/23/2021 05/09/18   Sherren Mocha, MD  ?fluticasone [facility]) 50 MCG/ACT nasal spray Place 1 spray into both nostrils daily for 3 days. 09/23/21 09/26/21  Gustavus Bryant, FNP  ?NEEDLE, DISP, 18 G 18G X  1-1/2" MISC 1 Units by Does not apply route once a week. 05/09/18   Sherren Mocha, MD  ?NEEDLE, DISP, 23 G 23G X 1" MISC 1 Units by Does not apply route once a week. 05/09/18   Sherren Mocha, MD  ?omeprazole (PRILOSEC) 40 MG capsule Take 1 capsule (40 mg total) by mouth daily. ?Patient not taking: Reported on 09/23/2021 05/09/18   Sherren Mocha, MD  ?predniSONE (DELTASONE) 20 MG tablet 3 Tabs PO Days 1-3, then 2 tabs PO Days 4-6, then 1 tab PO Day 7-9, then Half Tab PO Day 10-12 ?Patient not taking: Reported on 09/23/2021 05/21/21   Fayrene Helper, PA-C  ?Syringe, Disposable, 1 ML MISC 1 Units by Does not apply route once a week. 05/09/18   Sherren Mocha, MD  ?testosterone cypionate (DEPOTESTOSTERONE CYPIONATE) 200 MG/ML injection Inject 0.4 mLs (80 mg total) into the muscle once a week. 05/09/18   Sherren Mocha, MD  ? ? ?Family History ?Family History  ?Problem Relation Age of Onset  ? Hypertension Mother   ? Hypertension Father   ? ? ?Social History ?Social History  ? ?Tobacco Use  ? Smoking status: Never  ? Smokeless tobacco: Current  ?  Types: Chew  ?Vaping Use  ? Vaping Use: Never used  ?Substance Use Topics  ? Alcohol use: Yes  ?  Comment: rare  ? Drug  use: Not Currently  ? ? ? ?Allergies   ?Patient has no known allergies. ? ? ?Review of Systems ?Review of Systems  ?All other systems reviewed and are negative. ? ? ?Physical Exam ?Triage Vital Signs ?ED Triage Vitals  ?Enc Vitals Group  ?   BP 04/05/22 1318 120/73  ?   Pulse Rate 04/05/22 1318 60  ?   Resp 04/05/22 1318 12  ?   Temp 04/05/22 1318 98.2 ?F (36.8 ?C)  ?   Temp Source 04/05/22 1318 Oral  ?   SpO2 04/05/22 1318 98 %  ?   Weight --   ?   Height --   ?   Head Circumference --   ?   Peak Flow --   ?   Pain Score 04/05/22 1328 6  ?   Pain Loc --   ?   Pain Edu? --   ?   Excl. in GC? --   ? ?No data found. ? ?Updated Vital Signs ?BP 117/75 (BP Location: Right Arm)   Pulse 63   Temp 97.9 ?F (36.6 ?C)   Resp 18   SpO2 97%  ? ?Visual Acuity ?Right Eye Distance:   ?Left  Eye Distance:   ?Bilateral Distance:   ? ?Right Eye Near:   ?Left Eye Near:    ?Bilateral Near:    ? ?Physical Exam ?Vitals reviewed.  ?Constitutional:   ?   Appearance: Normal appearance.  ?Musculoskeletal:     ?   General: Swelling and tenderness present.  ?   Comments: Tender right wrist,  pain with range of motion nv and ns intact    ?Skin: ?   General: Skin is warm.  ?Neurological:  ?   General: No focal deficit present.  ?   Mental Status: He is alert.  ?Psychiatric:     ?   Mood and Affect: Mood normal.  ? ? ? ?UC Treatments / Results  ?Labs ?(all labs ordered are listed, but only abnormal results are displayed) ?Labs Reviewed - No data to display ? ?EKG ? ? ?Radiology ?DG Wrist Complete Right ? ?Result Date: 04/05/2022 ?CLINICAL DATA:  Wrist pain.  No known injury. EXAM: RIGHT WRIST - COMPLETE 3+ VIEW COMPARISON:  Hand radiograph 04/14/2016 FINDINGS: The mineralization and alignment are normal. There is no evidence of acute fracture or dislocation. The joint spaces appear preserved. No foreign bodies or other focal soft tissue abnormalities are identified. IMPRESSION: Negative right wrist radiographs. Electronically Signed   By: Carey BullocksWilliam  Veazey M.D.   On: 04/05/2022 13:50   ? ?Procedures ?Procedures (including critical care time) ? ?Medications Ordered in UC ?Medications - No data to display ? ?Initial Impression / Assessment and Plan / UC Course  ?I have reviewed the triage vital signs and the nursing notes. ? ?Pertinent labs & imaging results that were available during my care of the patient were reviewed by me and considered in my medical decision making (see chart for details). ? ?  ?X-ray obtained and is negative.  Patient counseled on probability that symptoms are secondary to tendinitis patient placed in a splint she is advised to take ibuprofen she is to follow-up with hand surgery if symptoms persist. ? ?Final Clinical Impressions(s) / UC Diagnoses  ? ?Final diagnoses:  ?Tendonitis of wrist, right   ? ? ? ?Discharge Instructions   ? ?  ?Ibuprofen for the next 3-4 days  ? ? ?ED Prescriptions   ?None ?  ? ?PDMP not reviewed this encounter. ?  An After Visit Summary was printed and given to the patient.  ?  ?Elson Areas, PA-C ?04/05/22 1553 ? ?

## 2022-04-05 NOTE — Discharge Instructions (Signed)
Ibuprofen for the next 3-4 days  ?

## 2022-04-05 NOTE — ED Notes (Signed)
Assited patient with setting up pcp appointment ?

## 2022-04-13 ENCOUNTER — Ambulatory Visit
Admission: EM | Admit: 2022-04-13 | Discharge: 2022-04-13 | Disposition: A | Discharge status: Home / Self Care | Payer: 59 | Attending: Emergency Medicine | Admitting: Emergency Medicine

## 2022-04-13 DIAGNOSIS — M778 Other enthesopathies, not elsewhere classified: Secondary | ICD-10-CM | POA: Diagnosis not present

## 2022-04-13 MED ORDER — DEXAMETHASONE SODIUM PHOSPHATE 10 MG/ML IJ SOLN
10.0000 mg | Freq: Once | INTRAMUSCULAR | Status: AC
  Administered 2022-04-13: 10 mg via INTRAMUSCULAR

## 2022-04-13 MED ORDER — MELOXICAM 7.5 MG PO TABS: 7.5000 mg | ORAL_TABLET | Freq: Every day | ORAL | 0 refills | Status: AC

## 2022-04-13 NOTE — ED Provider Notes (Signed)
?[place] ? ? ? ?CSN: [id number] ?Arrival date & time: 04/13/22  1258 ? ?  ? ?History   ?Chief Complaint ?Chief Complaint  ?Patient presents with  ? Wrist Pain  ?  R  ? ? ?HPI ?Tina Walsh is a 26 y.o. adult.  ?He presents today with follow up on wrist pain. Was seen 5/8 and diagnosed with tendonitis. Has been wearing brace. Patient has not tried any medication for the pain as he does not like to take anything. Was recommended to follow up with orthopedics but has not been able to seen them yet. He has been going to work where he has been told to continue working at his regular level with lifting and lots of wrist movement. Has pain with these motions. ? ?History reviewed. No pertinent past medical history. ? ?Patient Active Problem List  ? Diagnosis Date Noted  ? Acne nodule 09/07/2018  ? Severe episode of recurrent major depressive disorder, without psychotic features ([place]) 09/07/2018  ? Female-to-female transgender person 09/07/2018  ? Sacral back pain 09/07/2018  ? Gastroesophageal reflux disease 09/07/2018  ? ? ?Past Surgical History:  ?Procedure Laterality Date  ? I & D EXTREMITY Right 04/14/2016  ? Procedure: REMOVAL FOREIGN BODY RIGHT HAND;  Surgeon: Charlotte Crumb, MD;  Location: [place];  Service: Orthopedics;  Laterality: Right;  ? ? ?OB History   ?No obstetric history on file. ?  ? ? ? ?Home Medications   ? ?Prior to Admission medications   ?Medication Sig Start Date End Date Taking? Authorizing Provider  ?meloxicam (MOBIC) 7.5 MG tablet Take 1 tablet (7.5 mg total) by mouth daily. 04/13/22  Yes Shevaun Lovan, Wells Guiles, PA-C  ?cetirizine (ZYRTEC) 10 MG tablet Take 1 tablet (10 mg total) by mouth daily for 10 days. 09/23/21 10/03/21  Teodora Medici, FNP  ?fluticasone (FLONASE) 50 MCG/ACT nasal spray Place 1 spray into both nostrils daily for 3 days. 09/23/21 09/26/21  Teodora Medici, FNP  ?NEEDLE, DISP, 18 G 18G X 1-1/2" MISC 1 Units by Does not apply route once a week. 05/09/18   Shawnee Knapp, MD   ?NEEDLE, DISP, 23 G 23G X 1" MISC 1 Units by Does not apply route once a week. 05/09/18   Shawnee Knapp, MD  ?Syringe, Disposable, 1 ML MISC 1 Units by Does not apply route once a week. 05/09/18   Shawnee Knapp, MD  ?testosterone cypionate (DEPOTESTOSTERONE CYPIONATE) 200 MG/ML injection Inject 0.4 mLs (80 mg total) into the muscle once a week. 05/09/18   Shawnee Knapp, MD  ? ? ?Family History ?Family History  ?Problem Relation Age of Onset  ? Hypertension Mother   ? Hypertension Father   ? ? ?Social History ?Social History  ? ?Tobacco Use  ? Smoking status: Never  ? Smokeless tobacco: Current  ?  Types: Chew  ?Vaping Use  ? Vaping Use: Never used  ?Substance Use Topics  ? Alcohol use: Yes  ?  Comment: rare  ? Drug use: Not Currently  ? ? ?Allergies   ?Patient has no known allergies. ? ?Review of Systems ?Review of Systems  ?Musculoskeletal:  Positive for arthralgias.  ?All other systems reviewed and are negative. ? ?As per HPI ? ?Physical Exam ?Triage Vital Signs ?ED Triage Vitals  ?Enc Vitals Group  ?   BP 04/13/22 1426 115/75  ?   Pulse Rate 04/13/22 1426 (!) 54  ?   Resp 04/13/22 1426 18  ?   Temp 04/13/22 1426  98.1 ?F (36.7 ?C)  ?   Temp Source 04/13/22 1426 Oral  ?   SpO2 04/13/22 1426 98 %  ?   Weight --   ?   Height --   ?   Head Circumference --   ?   Peak Flow --   ?   Pain Score 04/13/22 1504 7  ?   Pain Loc --   ?   Pain Edu? --   ?   Excl. in [place]? --   ? ?No data found. ? ?Updated Vital Signs ?BP 115/75 (BP Location: Right Arm)   Pulse (!) 54   Temp 98.1 ?F (36.7 ?C) (Oral)   Resp 18   SpO2 98%  ? ?Physical Exam ?Vitals and nursing note reviewed.  ?Constitutional:   ?   General: He is not in acute distress. ?   Appearance: He is well-developed.  ?HENT:  ?   Mouth/Throat:  ?   Mouth: Mucous membranes are moist.  ?   Pharynx: Oropharynx is clear.  ?Eyes:  ?   Conjunctiva/sclera: Conjunctivae normal.  ?Cardiovascular:  ?   Rate and Rhythm: Normal rate and regular rhythm.  ?   Heart sounds: Normal heart sounds.   ?Pulmonary:  ?   Effort: Pulmonary effort is normal. No respiratory distress.  ?   Breath sounds: Normal breath sounds.  ?Abdominal:  ?   General: Bowel sounds are normal.  ?   Palpations: Abdomen is soft.  ?   Tenderness: There is no abdominal tenderness.  ?Musculoskeletal:     ?   General: No deformity.  ?   Cervical back: Normal range of motion.  ?   Comments: Splint in place on right wrist. Normal ROM but some pain with motion. Radial pulses intact, sensation intact.  ?Lymphadenopathy:  ?   Cervical: No cervical adenopathy.  ?Neurological:  ?   Mental Status: He is alert.  ?   Sensory: Sensation is intact.  ?   Motor: Motor function is intact.  ?   Coordination: Coordination is intact.  ? ? ?UC Treatments / Results  ?Labs ?(all labs ordered are listed, but only abnormal results are displayed) ?Labs Reviewed - No data to display ? ?EKG ? ?Radiology ?No results found. ? ?Procedures ?Procedures (including critical care time) ? ?Medications Ordered in UC ?Medications  ?dexamethasone (DECADRON) injection 10 mg (10 mg Intramuscular Given 04/13/22 1503)  ? ? ?Initial Impression / Assessment and Plan / UC Course  ?I have reviewed the triage vital signs and the nursing notes. ? ?Pertinent labs & imaging results that were available during my care of the patient were reviewed by me and considered in my medical decision making (see chart for details). ? ?Discussed with patient to try daily mobic for antiinflammatory purposes. We discussed if this medication is not affordable he can try over the counter ibuprofen every 6 hours. Patient understands this will help with wrist pain. He will also try ice on the area for inflammation. Provided with sports medicine clinic contact information for follow up. Also will provide work note requesting light duties. Decadron shot given in clinic for symptomatic relief. Patient states some pain has improved after the shot. ?Return precautions discussed. Patient agrees to plan and is  discharged in stable condition. ? ?Final Clinical Impressions(s) / UC Diagnoses  ? ?Final diagnoses:  ?Tendonitis of wrist, right  ? ? ? ?Discharge Instructions   ? ?  ?Please take medication as prescribed. ?I recommend to take the meloxicam with  food. ? ?Please follow up with orthopedics or sports medicine - I have attached contact information for a practice in the area.  ? ?Please return to the urgent care or emergency department if symptoms worsen or do not improve. ? ? ? ?ED Prescriptions   ? ? Medication Sig Dispense Auth. Provider  ? meloxicam (MOBIC) 7.5 MG tablet Take 1 tablet (7.5 mg total) by mouth daily. 7 tablet Alante Weimann, Wells Guiles, PA-C  ? ?  ? ?PDMP not reviewed this encounter. ?  ?Kevontae Burgoon, Wells Guiles, PA-C ?04/13/22 1552 ? ?

## 2022-04-13 NOTE — ED Triage Notes (Signed)
Last seen on 5/8 for right wrist pain. Pt reports that sxs have not improved. Has continued to wear the wrist brace. Bending aggravates sxs. ?

## 2022-04-13 NOTE — Discharge Instructions (Addendum)
Please take medication as prescribed. ?I recommend to take the meloxicam with food. ? ?Please follow up with orthopedics or sports medicine - I have attached contact information for a practice in the area.  ? ?Please return to the urgent care or emergency department if symptoms worsen or do not improve. ?

## 2022-05-05 ENCOUNTER — Ambulatory Visit
Admission: EM | Admit: 2022-05-05 | Discharge: 2022-05-05 | Disposition: A | Discharge status: Home / Self Care | Payer: 59 | Attending: Internal Medicine | Admitting: Internal Medicine

## 2022-05-05 DIAGNOSIS — A084 Viral intestinal infection, unspecified: Secondary | ICD-10-CM

## 2022-05-05 DIAGNOSIS — R112 Nausea with vomiting, unspecified: Secondary | ICD-10-CM | POA: Diagnosis not present

## 2022-05-05 DIAGNOSIS — R197 Diarrhea, unspecified: Secondary | ICD-10-CM | POA: Diagnosis not present

## 2022-05-05 MED ORDER — ONDANSETRON 4 MG PO TBDP: 4.0000 mg | ORAL_TABLET | Freq: Three times a day (TID) | ORAL | 0 refills | Status: DC | PRN

## 2022-05-05 NOTE — ED Triage Notes (Signed)
Pt states he took motrin about an hour and half ago before arrival

## 2022-05-05 NOTE — ED Triage Notes (Signed)
Pt presents with nausea, vomiting, and diarrhea since yesterday.

## 2022-05-05 NOTE — ED Provider Notes (Addendum)
[name]    CSN: [id number] Arrival date & time: 05/05/22  1316      History   Chief Complaint Chief Complaint  Patient presents with   Emesis   Diarrhea    HPI Tina Walsh is a 26 y.o. adult.   Patient presents with nausea, vomiting, diarrhea, generalized abdominal pain that started yesterday.  Patient reports that a family member have had similar symptoms recently.  Patient has taken Motrin for symptoms.  Denies blood in stool or emesis.  Patient has not been able to keep any food or fluids down since symptoms started.  Abdominal pain is mild and is usually related to eating or drinking.  Denies any known fevers at home.  Denies any associated respiratory symptoms, cough, sore throat.   Emesis Diarrhea  History reviewed. No pertinent past medical history.  Patient Active Problem List   Diagnosis Date Noted   Acne nodule 09/07/2018   Severe episode of recurrent major depressive disorder, without psychotic features (HCC) 09/07/2018   Female-to-female transgender person 09/07/2018   Sacral back pain 09/07/2018   Gastroesophageal reflux disease 09/07/2018    Past Surgical History:  Procedure Laterality Date   I & D EXTREMITY Right 04/14/2016   Procedure: REMOVAL FOREIGN BODY RIGHT HAND;  Surgeon: Dairl Ponder, MD;  Location: [facility];  Service: Orthopedics;  Laterality: Right;    OB History   No obstetric history on file.      Home Medications    Prior to Admission medications   Medication Sig Start Date End Date Taking? Authorizing Provider  ondansetron (ZOFRAN-ODT) 4 MG disintegrating tablet Take 1 tablet (4 mg total) by mouth every 8 (eight) hours as needed for nausea or vomiting. 05/05/22  Yes Abbie Jablon, Acie Fredrickson, FNP  cetirizine (ZYRTEC) 10 MG tablet Take 1 tablet (10 mg total) by mouth daily for 10 days. 09/23/21 10/03/21  Gustavus Bryant, FNP  fluticasone (FLONASE) 50 MCG/ACT nasal spray Place 1 spray into both nostrils daily for 3 days. 09/23/21  09/26/21  Gustavus Bryant, FNP  meloxicam (MOBIC) 7.5 MG tablet Take 1 tablet (7.5 mg total) by mouth daily. 04/13/22   Rising, Rebecca, PA-C  NEEDLE, DISP, 18 G 18G X 1-1/2" MISC 1 Units by Does not apply route once a week. 05/09/18   Sherren Mocha, MD  NEEDLE, DISP, 23 G 23G X 1" MISC 1 Units by Does not apply route once a week. 05/09/18   Sherren Mocha, MD  Syringe, Disposable, 1 ML MISC 1 Units by Does not apply route once a week. 05/09/18   Sherren Mocha, MD  testosterone cypionate (DEPOTESTOSTERONE CYPIONATE) 200 MG/ML injection Inject 0.4 mLs (80 mg total) into the muscle once a week. 05/09/18   Sherren Mocha, MD    Family History Family History  Problem Relation Age of Onset   Hypertension Mother    Hypertension Father     Social History Social History   Tobacco Use   Smoking status: Never   Smokeless tobacco: Current    Types: Chew  Vaping Use   Vaping Use: Never used  Substance Use Topics   Alcohol use: Yes    Comment: rare   Drug use: Not Currently     Allergies   Patient has no known allergies.   Review of Systems Review of Systems Per HPI  Physical Exam Triage Vital Signs ED Triage Vitals  Enc Vitals Group     BP 05/05/22 1504 113/69  Pulse Rate 05/05/22 1504 93     Resp 05/05/22 1504 17     Temp 05/05/22 1504 100.3 F (37.9 C)     Temp Source 05/05/22 1504 Oral     SpO2 05/05/22 1504 96 %     Weight --      Height --      Head Circumference --      Peak Flow --      Pain Score 05/05/22 1503 1     Pain Loc --      Pain Edu? --      Excl. in [place]? --    No data found.  Updated Vital Signs BP 113/69 (BP Location: Right Arm)   Pulse 93   Temp 100.3 F (37.9 C) (Oral)   Resp 17   SpO2 96%   Visual Acuity Right Eye Distance:   Left Eye Distance:   Bilateral Distance:    Right Eye Near:   Left Eye Near:    Bilateral Near:     Physical Exam Constitutional:      General: He is not in acute distress.    Appearance: Normal appearance. He is not  toxic-appearing or diaphoretic.  HENT:     Head: Normocephalic and atraumatic.     Mouth/Throat:     Mouth: Mucous membranes are moist.     Pharynx: No posterior oropharyngeal erythema.  Eyes:     Extraocular Movements: Extraocular movements intact.     Conjunctiva/sclera: Conjunctivae normal.  Cardiovascular:     Rate and Rhythm: Normal rate and regular rhythm.     Pulses: Normal pulses.     Heart sounds: Normal heart sounds.  Pulmonary:     Effort: Pulmonary effort is normal. No respiratory distress.     Breath sounds: Normal breath sounds.  Abdominal:     General: Bowel sounds are normal. There is no distension.     Palpations: Abdomen is soft.     Tenderness: There is no abdominal tenderness.  Neurological:     General: No focal deficit present.     Mental Status: He is alert and oriented to person, place, and time. Mental status is at baseline.  Psychiatric:        Mood and Affect: Mood normal.        Behavior: Behavior normal.        Thought Content: Thought content normal.        Judgment: Judgment normal.     UC Treatments / Results  Labs (all labs ordered are listed, but only abnormal results are displayed) Labs Reviewed - No data to display  EKG   Radiology No results found.  Procedures Procedures (including critical care time)  Medications Ordered in UC Medications - No data to display  Initial Impression / Assessment and Plan / UC Course  I have reviewed the triage vital signs and the nursing notes.  Pertinent labs & imaging results that were available during my care of the patient were reviewed by me and considered in my medical decision making (see chart for details).     Suspect viral cause to patient's symptoms given physical exam and patient's close exposure to similar symptoms.  Will treat with ondansetron to take as needed for nausea as this should be safe given the patient denies that they take any daily medications.  No signs of dehydration  on exam but patient was advised to encourage adequate fluid hydration.  Discussed supportive care as well.  Patient was advised to  go to the hospital if symptoms do not improve or if they worsen.  Discussed return precautions.  Patient verbalized understanding and was agreeable with plan. Final Clinical Impressions(s) / UC Diagnoses   Final diagnoses:  Viral gastroenteritis  Nausea vomiting and diarrhea     Discharge Instructions      It appears that you may have a viral stomach virus.  This should run its course and self resolve.  You have been prescribed a nausea medication to take as needed.  Please ensure adequate fluid hydration.  Go to the hospital if symptoms persist or worsen.    ED Prescriptions     Medication Sig Dispense Auth. Provider   ondansetron (ZOFRAN-ODT) 4 MG disintegrating tablet Take 1 tablet (4 mg total) by mouth every 8 (eight) hours as needed for nausea or vomiting. 20 tablet [place], Acie Fredrickson, [place]      PDMP not reviewed this encounter.   Gustavus Bryant, [place] 05/05/22 1611    Gustavus Bryant, [place] 05/05/22 [id number]

## 2022-05-05 NOTE — Discharge Instructions (Signed)
It appears that you may have a viral stomach virus.  This should run its course and self resolve.  You have been prescribed a nausea medication to take as needed.  Please ensure adequate fluid hydration.  Go to the hospital if symptoms persist or worsen.

## 2022-05-23 IMAGING — DX DG WRIST COMPLETE 3+V*R*
4 series · 4 of 4 positions shown · non-contrast
Comparison: Hand radiograph 04/14/2016

CLINICAL DATA: Wrist pain.  No known injury.

EXAM:
RIGHT WRIST - COMPLETE 3+ VIEW

[wrist pa (1 of 2)]
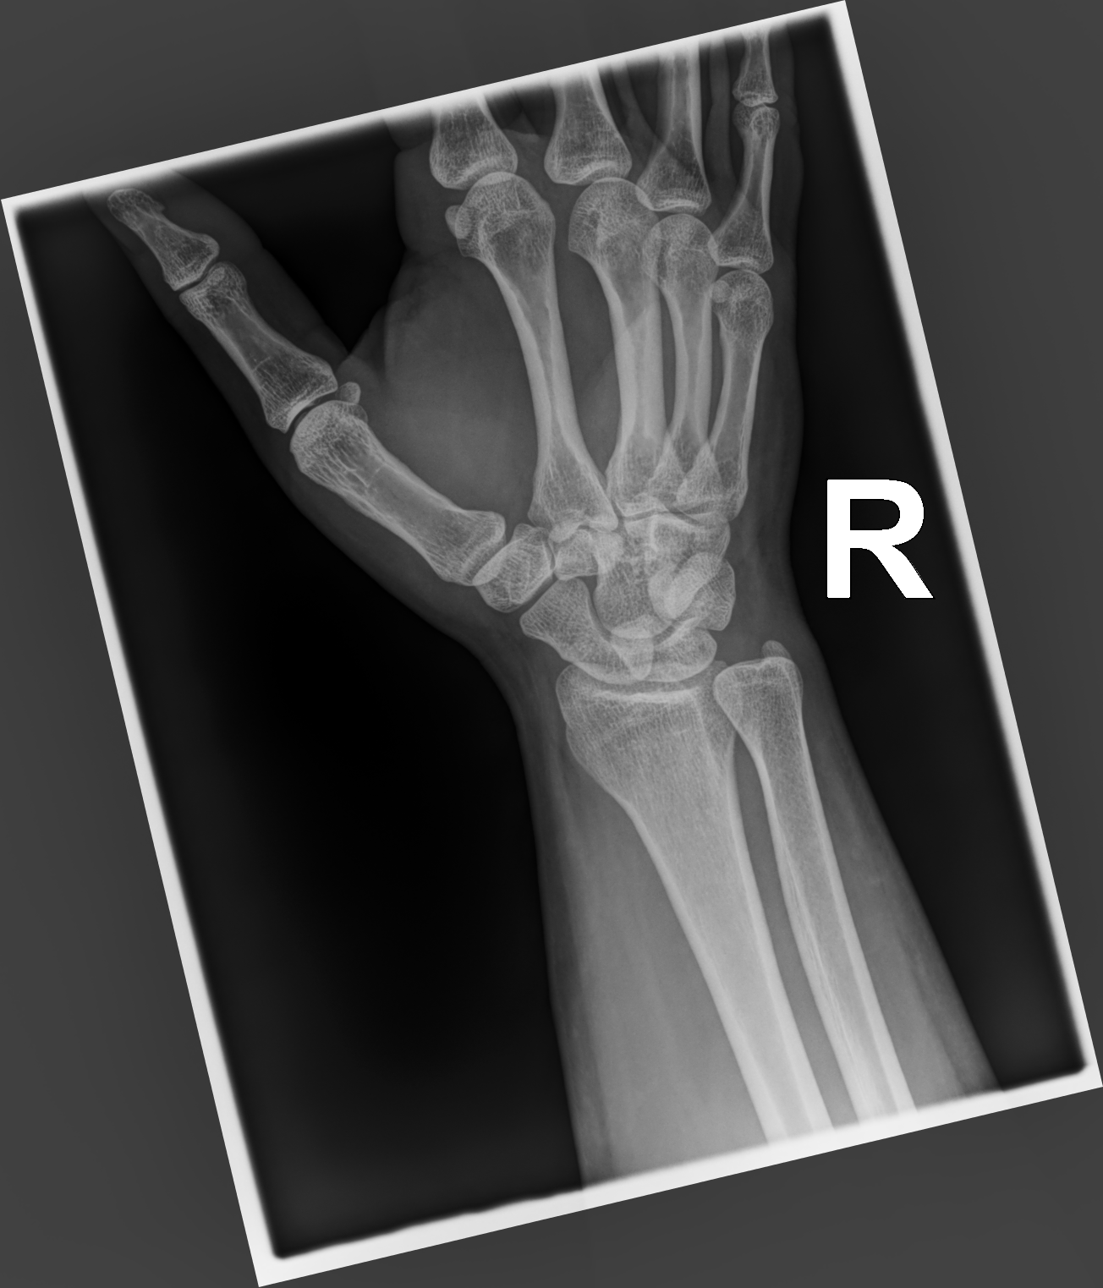

[wrist pa (2 of 2)]
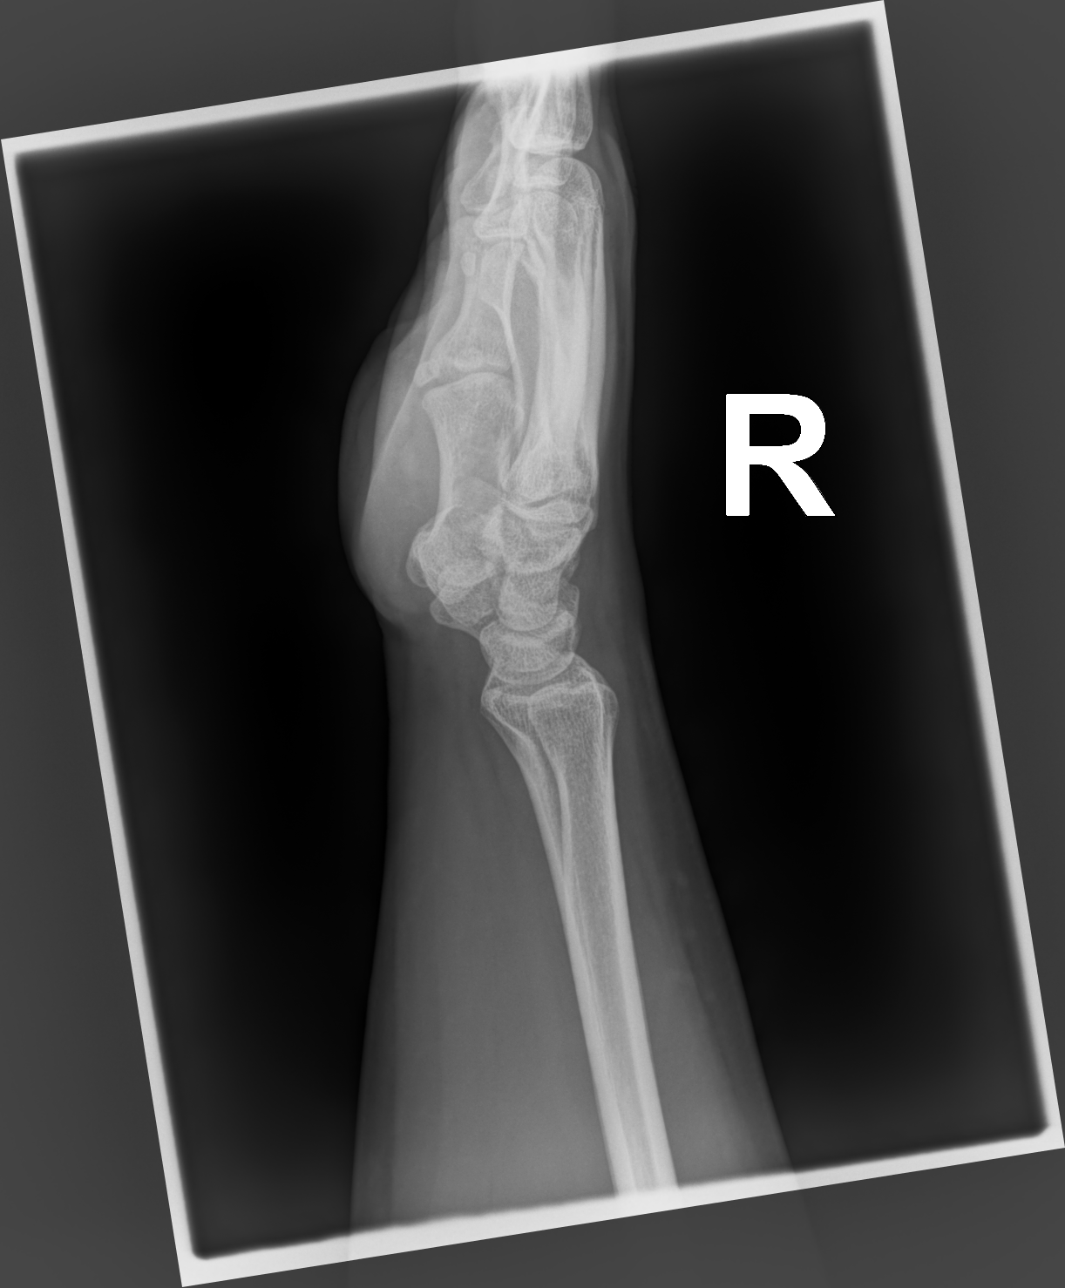

[wrist lat]
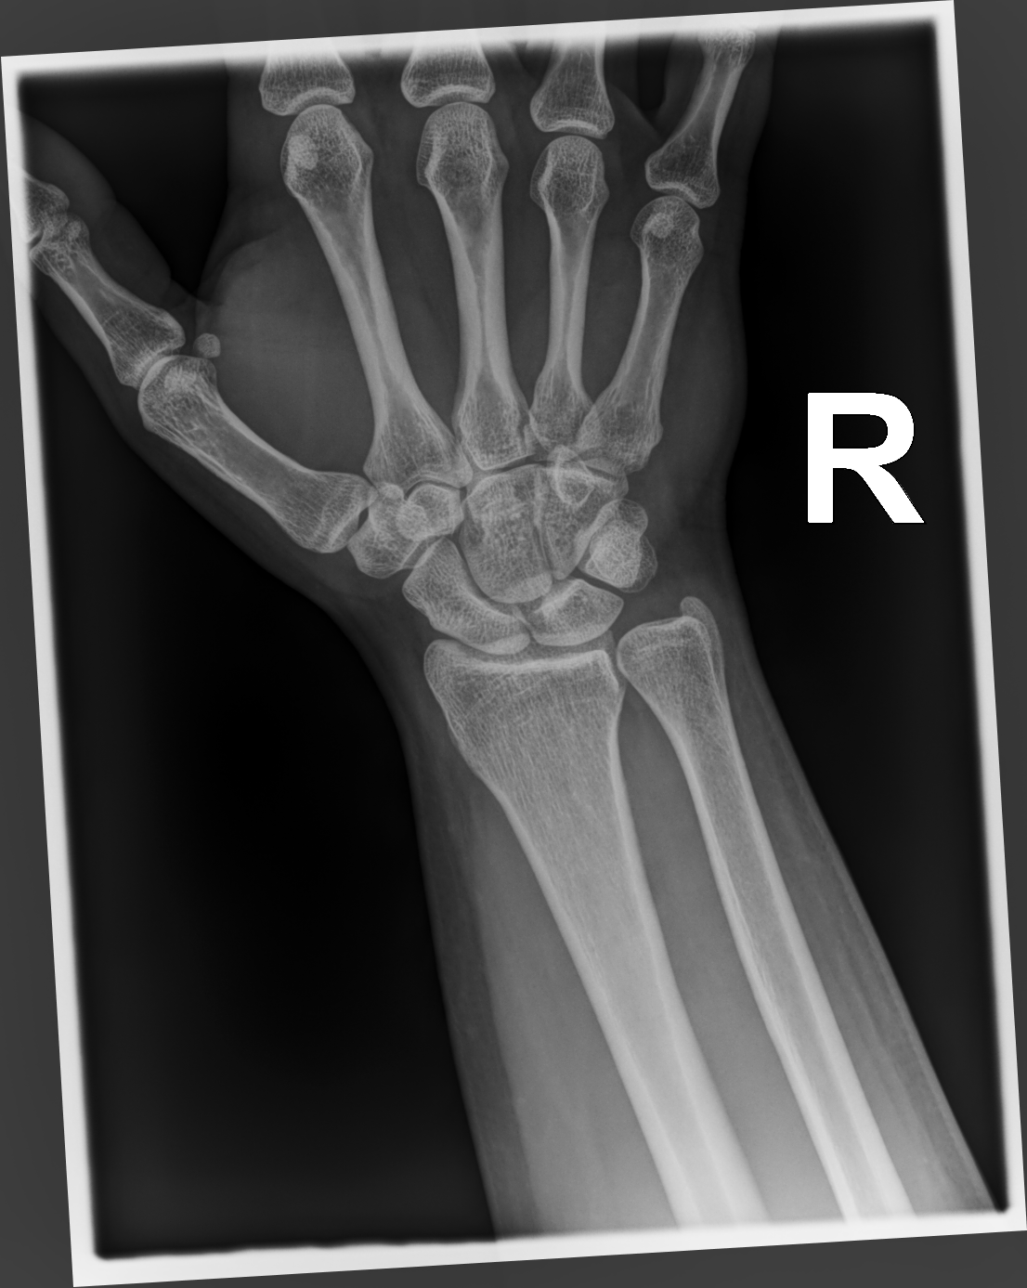

[scaphoid (os scaphoideum i) pa]
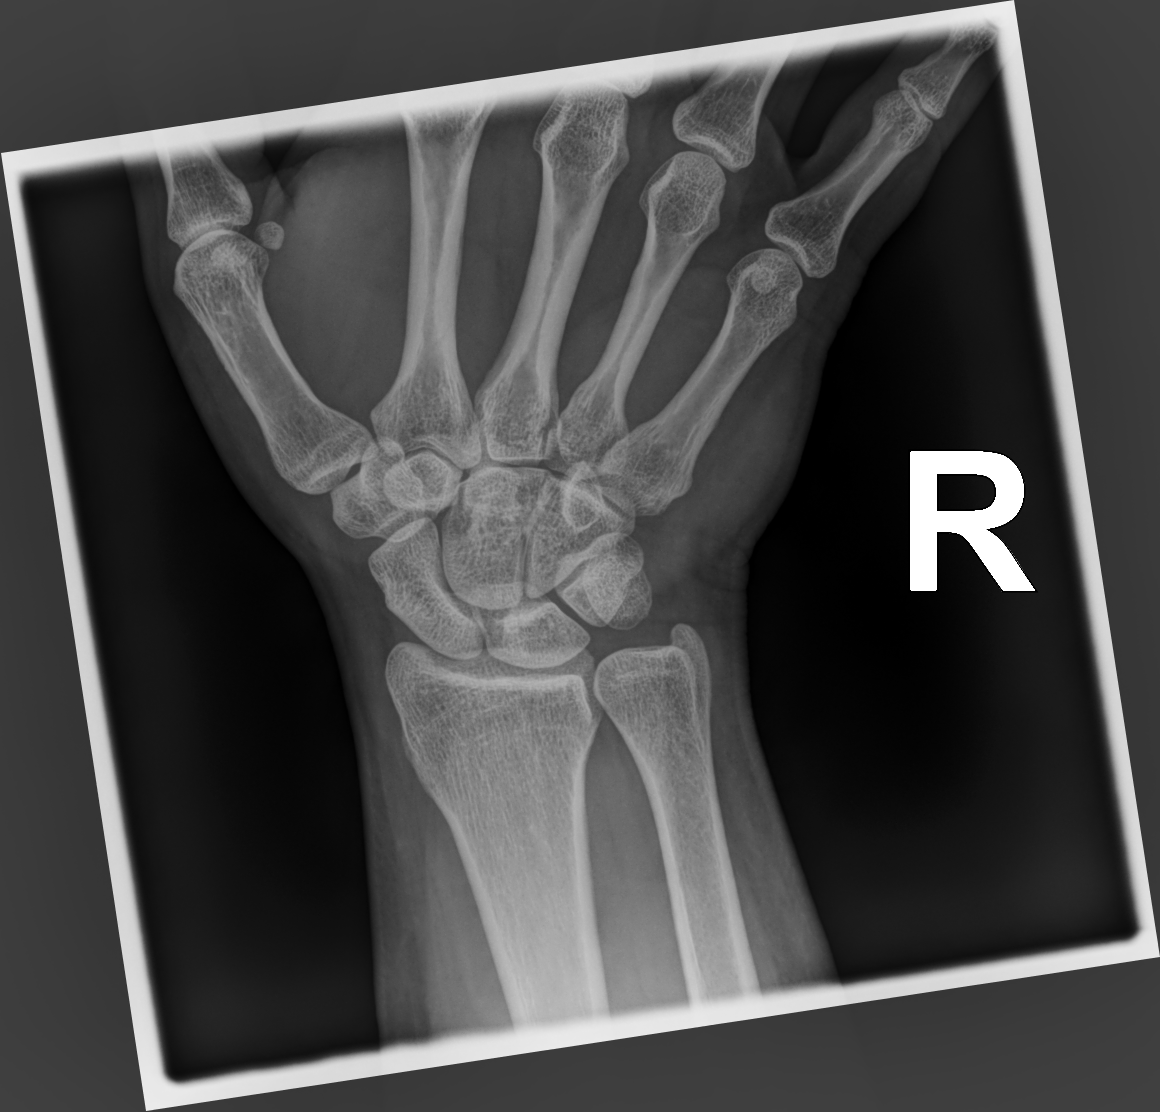

[4 of 4 positions shown; findings below may reference images not displayed]

FINDINGS: The mineralization and alignment are normal. There is no evidence of
acute fracture or dislocation. The joint spaces appear preserved. No
foreign bodies or other focal soft tissue abnormalities are
identified.
IMPRESSION: Negative right wrist radiographs.

## 2022-06-28 NOTE — Progress Notes (Signed)
Erroneous encounter-disregard

## 2022-07-08 ENCOUNTER — Encounter: Payer: 59 | Admitting: Family

## 2022-07-08 DIAGNOSIS — Z7689 Persons encountering health services in other specified circumstances: Secondary | ICD-10-CM

## 2022-12-01 ENCOUNTER — Ambulatory Visit (INDEPENDENT_AMBULATORY_CARE_PROVIDER_SITE_OTHER): Payer: BC Managed Care – PPO | Admitting: Family Medicine

## 2022-12-01 ENCOUNTER — Encounter: Payer: Self-pay | Admitting: Family Medicine

## 2022-12-01 VITALS — BP 102/64 | HR 80 | Ht 63.0 in | Wt 166.0 lb

## 2022-12-01 DIAGNOSIS — Z789 Other specified health status: Secondary | ICD-10-CM | POA: Diagnosis not present

## 2022-12-01 DIAGNOSIS — F332 Major depressive disorder, recurrent severe without psychotic features: Secondary | ICD-10-CM | POA: Diagnosis not present

## 2022-12-01 MED ORDER — TESTOSTERONE CYPIONATE 200 MG/ML IM SOLN: INTRAMUSCULAR | 1 refills | Status: DC

## 2022-12-01 MED ORDER — "BD BLUNT FILTER NEEDLE 18G X 1-1/2"" MISC": 12 refills | Status: DC

## 2022-12-01 MED ORDER — ESCITALOPRAM OXALATE 10 MG PO TABS: 10.0000 mg | ORAL_TABLET | Freq: Every day | ORAL | 1 refills | Status: DC

## 2022-12-01 MED ORDER — BD SYRINGE LUER-LOK 1 ML MISC: 2 refills | Status: AC

## 2022-12-01 MED ORDER — "BD DISP NEEDLE 25G X 1"" MISC": 3 refills | Status: DC

## 2022-12-01 NOTE — Assessment & Plan Note (Signed)
Restart testosterone therapy.  Will go with a lower dose initially and I suspect we will be fine with that dose as he is still maintaining fair amount of facial hair despite not being on regular testosterone now for quite a while.  Higher dose previously had side effect of fairly severe cystic acne.  Turn to clinic 4 to 6 weeks.

## 2022-12-01 NOTE — Patient Instructions (Signed)
I have called in your  testosterone, needles and syringes. If there is any problem, please let me know.  You can call the office and leave me a message OR you can Use MYCHART (which I would definitely recommend setting up)  I am also restarting yur antidepressant with Lexapro. If you have issues with htat, please let me know.  If your girlfriend needs someone to show her how to do the subcutaneous injects (under the skin) that my nirse showed you today, please call amd make an appointment to see the nirse only. You would both have to be there for that.  The website I use for a lot of my references is [employer] transgender guidelines. I think theya re most up to date and accurate.  Great to meet you. I will plan on seeing you in 4-6 weeks and we will draw some labs then.  Please call or MyChart if you have issues.

## 2022-12-01 NOTE — Assessment & Plan Note (Signed)
Feeling a little depressed but not nearly as depressed as prior episode.  Does wonder reconsider starting therapy.  Had no side effects in the past with Celexa but was not sure that it really did as much as he wanted to do.  Will start today with Lexapro and follow-up in 2 to 4 weeks.

## 2022-12-01 NOTE — Progress Notes (Signed)
    CHIEF COMPLAINT / HPI:  Sexual health and transgender history  How do you identify TRANSMAN/TRANS Preferred pronouns and name: he/him/his  Approximate date of onset of general dysphoria if known: Review of age or before Has Hormonal therapy been started and if so approximate date of initiation: Initiated testosterone injections in 2019 but has had intermittent treatment since due to finances and lack of healthcare. Dosing: Most recent dosage was 80 mg testosterone cypionate weekly.   Side effects or concerns?He notes that he did have a lot of acne with that dosage.  He says he was anxious to have transition.  During this time of only intermittent testosterone dosing, he has had some occasional vaginal spotting/bleeding but no true menstrual cycle.  He has maintained facial hair.  He has lost muscle mass.  Any completed surgical interventions and approximate date if known: None currently  Surgical interventions desired or planned?  Really would like to pursue top surgery, hysterectomy  Who is your emotional support system?  Long-term relationship with his girlfriend.  She had to children from previous relationship, 13 and 27 years of age.  She is currently pregnant with their child, female due in a few months.  Fiance is Ruffin Frederick..  Which of the following people are knowledgeable and are supportive of your transition/gender expression? Family of origin not supportive. Friends, co workers, employer and fiance are supportive  Have you changed your legal name or gender on any of your IDs?  No  Any specific concerns or questions? None. Is interested in subQ shots vs IM. Has been having fiance do  IM injections in buttocks for him as he hates needles, Signed consent form for hormonal therapy? Yes. Reviewed side effects, especially those that may be irreversible.    2.  Wants to restart SSRI therapy.  PERTINENT  PMH / PSH: I have reviewed the patient's medications, allergies, past  medical and surgical history, smoking status and updated in the EMR as appropriate. Never smoker Current chew/dip snuff daily Hx acne with highjer doses of testosterone (cysttc)  OBJECTIVE:  BP 102/64   Pulse 80   Ht 5\' 3"  (1.6 m)   Wt 166 lb (75.3 kg)   SpO2 99%   BMI 29.41 kg/m  Vital signs reviewed. GENERAL: Well-developed, well-nourished, no acute distress. CARDIOVASCULAR: Regular rate and rhythm no murmur gallop or rub LUNGS: no increased work of breathing NEURO: No gross focal neurological deficits. MSK: Movement of extremity x 4. PSYCH: AxOx4. Good eye contact.. No psychomotor retardation or agitation. Appropriate speech fluency and content. Asks and answers questions appropriately. Mood is congruent.    ASSESSMENT / PLAN:   Female-to-female transgender person Restart testosterone therapy.  Will go with a lower dose initially and I suspect we will be fine with that dose as he is still maintaining fair amount of facial hair despite not being on regular testosterone now for quite a while.  Higher dose previously had side effect of fairly severe cystic acne.  Turn to clinic 4 to 6 weeks.  Severe episode of recurrent major depressive disorder, without psychotic features ([place]) Feeling a little depressed but not nearly as depressed as prior episode.  Does wonder reconsider starting therapy.  Had no side effects in the past with Celexa but was not sure that it really did as much as he wanted to do.  Will start today with Lexapro and follow-up in 2 to 4 weeks.   Dorcas Mcmurray MD

## 2022-12-08 ENCOUNTER — Telehealth: Payer: Self-pay

## 2022-12-08 NOTE — Telephone Encounter (Signed)
Prior Auth for patients medication TESTOSTERONE INJ. approved by [facility] from 12/08/22 to 12/07/25.

## 2022-12-08 NOTE — Telephone Encounter (Signed)
A Prior Authorization was initiated for this patients TESTOSTERONE 200MG /ML INJECTION through CoverMyMeds.   Key: [id number]

## 2023-02-21 ENCOUNTER — Telehealth: Payer: Self-pay | Admitting: *Deleted

## 2023-02-21 DIAGNOSIS — Z789 Other specified health status: Secondary | ICD-10-CM

## 2023-02-21 NOTE — Telephone Encounter (Signed)
Pt reports that he needs a refill on his needles for his testosterone needles -  30g

## 2023-02-22 MED ORDER — "BD DISP NEEDLE 25G X 1"" MISC": 3 refills | Status: DC

## 2023-03-03 ENCOUNTER — Encounter: Payer: Self-pay | Admitting: Family Medicine

## 2023-04-06 ENCOUNTER — Encounter: Payer: Self-pay | Admitting: Family Medicine

## 2023-04-06 ENCOUNTER — Ambulatory Visit (INDEPENDENT_AMBULATORY_CARE_PROVIDER_SITE_OTHER): Payer: BC Managed Care – PPO | Admitting: Family Medicine

## 2023-04-06 VITALS — BP 100/60 | HR 58 | Ht 63.0 in | Wt 172.2 lb

## 2023-04-06 DIAGNOSIS — Z789 Other specified health status: Secondary | ICD-10-CM | POA: Diagnosis not present

## 2023-04-06 DIAGNOSIS — F332 Major depressive disorder, recurrent severe without psychotic features: Secondary | ICD-10-CM | POA: Diagnosis not present

## 2023-04-06 DIAGNOSIS — Z5181 Encounter for therapeutic drug level monitoring: Secondary | ICD-10-CM | POA: Diagnosis not present

## 2023-04-06 MED ORDER — "BD DISP NEEDLE 25G X 1"" MISC": 3 refills | Status: DC

## 2023-04-06 MED ORDER — TESTOSTERONE CYPIONATE 200 MG/ML IM SOLN: INTRAMUSCULAR | 1 refills | Status: DC

## 2023-04-06 MED ORDER — "NEEDLE (DISP) 23G X 1"" MISC": 1.0000 [IU] | 4 refills | Status: DC

## 2023-04-06 NOTE — Assessment & Plan Note (Signed)
Mood seems to improve on testosterone so he did not restart Celexa.  He will continue to monitor.

## 2023-04-06 NOTE — Progress Notes (Signed)
    CHIEF COMPLAINT / HPI: #1.  Follow-up testosterone treatment for transgender.  Doing well with the testosterone.  At last time he used that, he had some problems with acne but currently has not noticed any increase in that. 2.  Depressive mood: At last office visit we restarted his Celexa however he never picked it up.  Said he felt so much better with restarting the testosterone he does not think he needs that right now.   PERTINENT  PMH / PSH: I have reviewed the patient's medications, allergies, past medical and surgical history, smoking status and updated in the EMR as appropriate.   OBJECTIVE:  BP 100/60   Pulse (!) 58   Ht 5\' 3"  (1.6 m)   Wt 172 lb 3.2 oz (78.1 kg)   SpO2 98%   BMI 30.50 kg/m   GENERAL: Well-developed female no acute distress CV: Regular rate and rhythm LUNGS: Clear to auscultation SKIN: Some facial scars from prior acne but no evidence of current acne. ASSESSMENT / PLAN:   Severe episode of recurrent major depressive disorder, without psychotic features (H[facility]) Mood seems to improve on testosterone so he did not restart Celexa.  He will continue to monitor.  Female-to-female transgender person Will check some labs today.  Follow-up 6 months unless there are other issues.  I will let him know about his labs.  If he develops any side effects from the testosterone, especially acne, he is to let me know immediately and we will start him on treatment for that.  He also wanted the name of surgeon and I gave him that.  Considering top surgery relatively soon.   Denny Levy MD

## 2023-04-06 NOTE — Assessment & Plan Note (Signed)
Will check some labs today.  Follow-up 6 months unless there are other issues.  I will let him know about his labs.  If he develops any side effects from the testosterone, especially acne, he is to let me know immediately and we will start him on treatment for that.  He also wanted the name of surgeon and I gave him that.  Considering top surgery relatively soon.

## 2023-04-06 NOTE — Patient Instructions (Addendum)
Dr. Marzetta Board is a surgeon here in town.: [phone]

## 2023-04-07 LAB — CBC
Hematocrit: 41.5 % (ref 34.0–46.6)
Hemoglobin: 13.6 g/dL (ref 11.1–15.9)
MCH: 30.1 pg (ref 26.6–33.0)
MCHC: 32.8 g/dL (ref 31.5–35.7)
MCV: 92 fL (ref 79–97)
Platelets: 307 10*3/uL (ref 150–450)
RBC: 4.52 x10E6/uL (ref 3.77–5.28)
RDW: 12.7 % (ref 11.7–15.4)
WBC: 5.2 10*3/uL (ref 3.4–10.8)

## 2023-04-07 LAB — BASIC METABOLIC PANEL
BUN/Creatinine Ratio: 15 (ref 9–23)
BUN: 12 mg/dL (ref 6–20)
CO2: 23 mmol/L (ref 20–29)
Calcium: 10.3 mg/dL — ABNORMAL HIGH (ref 8.7–10.2)
Chloride: 103 mmol/L (ref 96–106)
Creatinine, Ser: 0.78 mg/dL (ref 0.57–1.00)
Glucose: 90 mg/dL (ref 70–99)
Potassium: 4.5 mmol/L (ref 3.5–5.2)
Sodium: 142 mmol/L (ref 134–144)
eGFR: 107 mL/min/{1.73_m2} (ref >59)

## 2023-04-07 LAB — LIPID PANEL
Chol/HDL Ratio: 3.5 ratio (ref 0.0–4.4)
Cholesterol, Total: 149 mg/dL (ref 100–199)
HDL: 43 mg/dL (ref >39)
LDL Chol Calc (NIH): 89 mg/dL (ref 0–99)
Triglycerides: 90 mg/dL (ref 0–149)
VLDL Cholesterol Cal: 17 mg/dL (ref 5–40)

## 2023-04-07 LAB — TESTOSTERONE: Testosterone: 265 ng/dL — ABNORMAL HIGH (ref 13–71)

## 2023-04-08 ENCOUNTER — Encounter: Payer: Self-pay | Admitting: Family Medicine

## 2023-04-21 ENCOUNTER — Ambulatory Visit: Payer: Self-pay

## 2023-06-01 ENCOUNTER — Encounter: Payer: Self-pay | Admitting: Family Medicine

## 2023-06-11 ENCOUNTER — Encounter: Payer: Self-pay | Admitting: Family Medicine

## 2023-06-13 MED ORDER — ERYTHROMYCIN 2 % EX GEL: Freq: Every day | CUTANEOUS | 2 refills | Status: AC

## 2023-08-26 ENCOUNTER — Ambulatory Visit
Admission: EM | Admit: 2023-08-26 | Discharge: 2023-08-26 | Disposition: A | Discharge status: Home / Self Care | Payer: BC Managed Care – PPO | Attending: Physician Assistant | Admitting: Physician Assistant

## 2023-08-26 DIAGNOSIS — R509 Fever, unspecified: Secondary | ICD-10-CM | POA: Diagnosis present

## 2023-08-26 DIAGNOSIS — Z1152 Encounter for screening for COVID-19: Secondary | ICD-10-CM | POA: Insufficient documentation

## 2023-08-26 DIAGNOSIS — J069 Acute upper respiratory infection, unspecified: Secondary | ICD-10-CM | POA: Insufficient documentation

## 2023-08-26 DIAGNOSIS — F17220 Nicotine dependence, chewing tobacco, uncomplicated: Secondary | ICD-10-CM | POA: Insufficient documentation

## 2023-08-26 LAB — SARS CORONAVIRUS 2 (TAT 6-24 HRS): SARS Coronavirus 2: NEGATIVE

## 2023-08-26 LAB — POCT FASTING CBG KUC MANUAL ENTRY: POCT Glucose (KUC): 117 mg/dL — AB (ref 70–99)

## 2023-08-26 LAB — POCT INFLUENZA A/B
Influenza A, POC: NEGATIVE
Influenza B, POC: NEGATIVE

## 2023-08-26 NOTE — ED Provider Notes (Signed)
[name]    CSN: [id number] Arrival date & time: 08/26/23  1045      History   Chief Complaint Chief Complaint  Patient presents with   Fever   Dizziness    HPI Tina Walsh is a 27 y.o. adult.   Patient here today for evaluation of fever, headache and chills.  They state that they were drinking moonshine the night before symptoms started and was unsure if they might of drank too much.  Temperature has been as high as 101 Fahrenheit.  If had headache, nausea, but no vomiting.  Over-the-counter medications have not cleared symptoms.  The history is provided by the patient.  Fever Associated symptoms: chills, congestion, headaches, myalgias and nausea   Associated symptoms: no cough, no diarrhea, no ear pain, no sore throat and no vomiting   Dizziness Associated symptoms: headaches and nausea   Associated symptoms: no diarrhea, no shortness of breath and no vomiting     History reviewed. No pertinent past medical history.  Patient Active Problem List   Diagnosis Date Noted   Tonsil stone 02/03/2022   Acne nodule 09/07/2018   Severe episode of recurrent major depressive disorder, without psychotic features (HCC) 09/07/2018   Female-to-female transgender person 09/07/2018   Gastroesophageal reflux disease 09/07/2018   Closed displaced fracture of styloid process of right ulna with routine healing 04/20/2016   Closed intra-articular die-punch fracture of radius with routine healing 04/20/2016   Superficial foreign body of right hand 04/20/2016    Past Surgical History:  Procedure Laterality Date   I & D EXTREMITY Right 04/14/2016   Procedure: REMOVAL FOREIGN BODY RIGHT HAND;  Surgeon: Dairl Ponder, MD;  Location: [facility] OR;  Service: Orthopedics;  Laterality: Right;    OB History   No obstetric history on file.      Home Medications    Prior to Admission medications   Medication Sig Start Date End Date Taking? Authorizing Provider  famotidine  (PEPCID) 20 MG tablet Take 20 mg by mouth daily. 03/21/16  Yes [provider]  testosterone cypionate (DEPOTESTOSTERONE CYPIONATE) 200 MG/ML injection Inject 0.2 ml whichis 40 mg under skin as directed weekly Patient taking differently: 200 mg. Inject 0.2 ml whichis 40 mg under skin as directed weekly. Last was 2 wks ago. Missed my last one. 04/06/23  Yes Nestor Ramp, MD  cetirizine (ZYRTEC) 10 MG tablet Take 1 tablet (10 mg total) by mouth daily for 10 days. 09/23/21 10/03/21  Gustavus Bryant, FNP  erythromycin with ethanol (EMGEL) 2 % gel Apply topically daily. 06/13/23 06/12/24  Nestor Ramp, MD  fluticasone (FLONASE) 50 MCG/ACT nasal spray Place 1 spray into both nostrils daily for 3 days. 09/23/21 09/26/21  Gustavus Bryant, FNP  meloxicam (MOBIC) 7.5 MG tablet Take 1 tablet (7.5 mg total) by mouth daily. 04/13/22   Rising, Aadyn Buchheit, PA-C  NEEDLE, DISP, 18 G (BD BLUNT FILTER NEEDLE) 18G X 1-1/2" MISC Use as directed for testosterone injection 12/01/22   Nestor Ramp, MD  NEEDLE, DISP, 23 G 23G X 1" MISC 1 Units by Does not apply route once a week. 04/06/23   Nestor Ramp, MD  NEEDLE, DISP, 25 G (B-D DISP NEEDLE 25GX1") 25G X 1" MISC Use weekly as directed to inject testosterone under skin as directed 04/06/23   Nestor Ramp, MD  omeprazole (PRILOSEC) 20 MG capsule Take 20 mg by mouth daily.    [provider]  ondansetron (ZOFRAN-ODT) 4 MG disintegrating  tablet Take 1 tablet (4 mg total) by mouth every 8 (eight) hours as needed for nausea or vomiting. 05/05/22   Gustavus Bryant, FNP  Syringe, Disposable, (B-D SYRINGE LUER-LOK 1CC) 1 ML MISC Use as directed with testosterone injection 12/01/22   Nestor Ramp, MD    Family History Family History  Problem Relation Age of Onset   Hypertension Mother    Hypertension Father     Social History Social History   Tobacco Use   Smoking status: Never   Smokeless tobacco: Current    Types: Chew  Vaping Use   Vaping status: Never Used  Substance  Use Topics   Alcohol use: Yes    Comment: Moonshine most recently.   Drug use: Not Currently     Allergies   Patient has no known allergies.   Review of Systems Review of Systems  Constitutional:  Positive for chills and fever.  HENT:  Positive for congestion. Negative for ear pain and sore throat.   Eyes:  Negative for discharge and redness.  Respiratory:  Negative for cough, shortness of breath and wheezing.   Gastrointestinal:  Positive for nausea. Negative for abdominal pain, diarrhea and vomiting.  Musculoskeletal:  Positive for myalgias.  Neurological:  Positive for dizziness and headaches.     Physical Exam Triage Vital Signs ED Triage Vitals  Encounter Vitals Group     BP --      Systolic BP Percentile --      Diastolic BP Percentile --      Pulse Rate 08/26/23 1057 76     Resp 08/26/23 1057 18     Temp 08/26/23 1057 99.3 F (37.4 C)     Temp Source 08/26/23 1057 Oral     SpO2 08/26/23 1057 97 %     Weight 08/26/23 1054 160 lb (72.6 kg)     Height 08/26/23 1054 5\' 3"  (1.6 m)     Head Circumference --      Peak Flow --      Pain Score 08/26/23 1052 5     Pain Loc --      Pain Education --      Exclude from Growth Chart --    No data found.   Updated Vital Signs Pulse 76   Temp 99.3 F (37.4 C) (Oral)   Resp 18   Ht 5\' 3"  (1.6 m)   Wt 160 lb (72.6 kg)   LMP  (LMP Unknown) Comment: "some spotting on/off".  SpO2 97%   BMI 28.34 kg/m   Visual Acuity Right Eye Distance:   Left Eye Distance:   Bilateral Distance:    Right Eye Near:   Left Eye Near:    Bilateral Near:     Physical Exam Vitals and nursing note reviewed.  Constitutional:      General: He is not in acute distress.    Appearance: Normal appearance. He is not ill-appearing.  HENT:     Head: Normocephalic and atraumatic.     Nose: Congestion present.     Mouth/Throat:     Mouth: Mucous membranes are moist.     Pharynx: No oropharyngeal exudate or posterior oropharyngeal  erythema.  Eyes:     Conjunctiva/sclera: Conjunctivae normal.  Cardiovascular:     Rate and Rhythm: Normal rate and regular rhythm.     Heart sounds: Normal heart sounds. No murmur heard. Pulmonary:     Effort: Pulmonary effort is normal. No respiratory distress.     Breath sounds:  Normal breath sounds. No wheezing, rhonchi or rales.  Skin:    General: Skin is warm and dry.  Neurological:     Mental Status: He is alert.  Psychiatric:        Mood and Affect: Mood normal.        Thought Content: Thought content normal.      UC Treatments / Results  Labs (all labs ordered are listed, but only abnormal results are displayed) Labs Reviewed  POCT FASTING CBG [facility] MANUAL ENTRY - Abnormal; Notable for the following components:      Result Value   POCT Glucose ([facility]) 117 (*)    All other components within normal limits  SARS CORONAVIRUS 2 (TAT 6-24 HRS)  POCT INFLUENZA A/B    EKG   Radiology No results found.  Procedures Procedures (including critical care time)  Medications Ordered in UC Medications - No data to display  Initial Impression / Assessment and Plan / UC Course  I have reviewed the triage vital signs and the nursing notes.  Pertinent labs & imaging results that were available during my care of the patient were reviewed by me and considered in my medical decision making (see chart for details).    Flu test negative in office.  CBG within normal limits.  Will order COVID screening.  Advised symptomatic treatment, increase fluids and rest.  Encouraged follow-up if no gradual improvement with any further concerns.  Final Clinical Impressions(s) / UC Diagnoses   Final diagnoses:  Acute upper respiratory infection   Discharge Instructions   None    ED Prescriptions   None    PDMP not reviewed this encounter.   Tomi Bamberger, PA-C 08/30/23 [phone]

## 2023-08-26 NOTE — ED Triage Notes (Signed)
"  Fever, Headache & Chills". "Tuesday night I was drinking moon shine and playing a game, I may have drank too much, I had a ha then and the next morning my head was pounding with tight muscles, occasional nausea, if I move to suddenly right or left I feel dizzy as well". "I did a COVID19 test yesterday and it was Negative". "Wednesday when I came home from work my temperature was up to 101".   Current symptoms today "sensitive skin, sweating a lot, headache, muscle pain, headache (and mainly my eyes) with sharp pains in my head". No vomiting. No fever known.

## 2023-08-30 ENCOUNTER — Encounter: Payer: Self-pay | Admitting: Physician Assistant

## 2023-10-10 ENCOUNTER — Encounter: Payer: Self-pay | Admitting: Family Medicine

## 2023-10-10 DIAGNOSIS — Z789 Other specified health status: Secondary | ICD-10-CM

## 2023-10-11 ENCOUNTER — Other Ambulatory Visit: Payer: Self-pay | Admitting: Family Medicine

## 2023-10-11 MED ORDER — BD BLUNT FILTER NEEDLE 18G X 1-1/2" MISC
12 refills | Status: DC
Start: 2023-10-11 — End: 2023-10-11

## 2023-10-11 MED ORDER — NEEDLE (DISP) 23G X 1" MISC: 1.0000 [IU] | 4 refills | Status: AC

## 2023-10-11 MED ORDER — BD BLUNT FILTER NEEDLE 18G X 1-1/2" MISC: 12 refills | Status: AC

## 2023-10-11 MED ORDER — "NEEDLE (DISP) 23G X 1"" MISC": 1.0000 [IU] | 4 refills | Status: DC

## 2023-10-17 ENCOUNTER — Ambulatory Visit
Admission: RE | Admit: 2023-10-17 | Discharge: 2023-10-17 | Disposition: A | Discharge status: Home / Self Care | Payer: BC Managed Care – PPO | Source: Ambulatory Visit | Attending: Physician Assistant | Admitting: Physician Assistant

## 2023-10-17 ENCOUNTER — Ambulatory Visit: Payer: BC Managed Care – PPO

## 2023-10-17 VITALS — BP 118/79 | HR 81 | Temp 98.5°F | Resp 16

## 2023-10-17 DIAGNOSIS — M25531 Pain in right wrist: Secondary | ICD-10-CM

## 2023-10-17 MED ORDER — PREDNISONE 20 MG PO TABS: 40.0000 mg | ORAL_TABLET | Freq: Every day | ORAL | 0 refills | Status: AC

## 2023-10-17 NOTE — ED Triage Notes (Signed)
Right wrist pain x 1 month. Works as a Curator, does a lot of repetitive motions with hands. No known injuries, denies numbness/tingling/weakness to hand/wrist.

## 2023-10-19 DIAGNOSIS — M67431 Ganglion, right wrist: Secondary | ICD-10-CM | POA: Insufficient documentation

## 2023-10-19 DIAGNOSIS — M25831 Other specified joint disorders, right wrist: Secondary | ICD-10-CM | POA: Insufficient documentation

## 2023-10-26 ENCOUNTER — Ambulatory Visit: Payer: BC Managed Care – PPO | Admitting: Family Medicine

## 2023-10-29 NOTE — ED Provider Notes (Signed)
[name]    CSN: [id number] Arrival date & time: 10/17/23  1706      History   Chief Complaint Chief Complaint  Patient presents with   Wrist Pain    Right wrist pain - Entered by patient    HPI Tina Walsh is a 27 y.o. adult.   Patient here today for evaluation of right wrist pain that started a month ago.  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  She denies any known injury.  She denies any numbness, tingling, weakness.  She has tried over-the-counter medication without resolution.  The history is provided by the patient.  Wrist Pain Pertinent negatives include no abdominal pain.    History reviewed. No pertinent past medical history.  Patient Active Problem List   Diagnosis Date Noted   Tonsil stone 02/03/2022   Acne nodule 09/07/2018   Severe episode of recurrent major depressive disorder, without psychotic features (HCC) 09/07/2018   Female-to-female transgender person 09/07/2018   Gastroesophageal reflux disease 09/07/2018   Closed displaced fracture of styloid process of right ulna with routine healing 04/20/2016   Closed intra-articular die-punch fracture of radius with routine healing 04/20/2016   Superficial foreign body of right hand 04/20/2016    Past Surgical History:  Procedure Laterality Date   I & D EXTREMITY Right 04/14/2016   Procedure: REMOVAL FOREIGN BODY RIGHT HAND;  Surgeon: Dairl Ponder, MD;  Location: [facility] OR;  Service: Orthopedics;  Laterality: Right;    OB History   No obstetric history on file.      Home Medications    Prior to Admission medications   Medication Sig Start Date End Date Taking? Authorizing Provider  cetirizine (ZYRTEC) 10 MG tablet Take 1 tablet (10 mg total) by mouth daily for 10 days. 09/23/21 10/03/21  Gustavus Bryant, FNP  erythromycin with ethanol (EMGEL) 2 % gel Apply topically daily. 06/13/23 06/12/24  Nestor Ramp, MD  famotidine (PEPCID) 20  MG tablet Take 20 mg by mouth daily. 03/21/16   [provider]  fluticasone (FLONASE) 50 MCG/ACT nasal spray Place 1 spray into both nostrils daily for 3 days. 09/23/21 09/26/21  Gustavus Bryant, FNP  meloxicam (MOBIC) 7.5 MG tablet Take 1 tablet (7.5 mg total) by mouth daily. 04/13/22   Rising, Rosine Solecki, PA-C  NEEDLE, DISP, 18 G (BD BLUNT FILTER NEEDLE) 18G X 1-1/2" MISC Use as directed for testosterone injection 10/11/23   Nestor Ramp, MD  NEEDLE, DISP, 23 G 23G X 1" MISC 1 Units by Does not apply route once a week. 10/11/23   Nestor Ramp, MD  NEEDLE, DISP, 25 G (B-D DISP NEEDLE 25GX1") 25G X 1" MISC Use weekly as directed to inject testosterone under skin as directed 04/06/23   Nestor Ramp, MD  omeprazole (PRILOSEC) 20 MG capsule Take 20 mg by mouth daily.    [provider]  ondansetron (ZOFRAN-ODT) 4 MG disintegrating tablet Take 1 tablet (4 mg total) by mouth every 8 (eight) hours as needed for nausea or vomiting. 05/05/22   Gustavus Bryant, FNP  Syringe, Disposable, (B-D SYRINGE LUER-LOK 1CC) 1 ML MISC Use as directed with testosterone injection 12/01/22   Nestor Ramp, MD  testosterone cypionate (DEPOTESTOSTERONE CYPIONATE) 200 MG/ML injection Inject 0.2 ml whichis 40 mg under skin as directed weekly Patient taking differently: 200 mg. Inject 0.2 ml whichis 40 mg under skin as directed weekly. Last was 2  wks ago. Missed my last one. 04/06/23   Nestor Ramp, MD    Family History Family History  Problem Relation Age of Onset   Hypertension Mother    Hypertension Father     Social History Social History   Tobacco Use   Smoking status: Never   Smokeless tobacco: Current    Types: Chew  Vaping Use   Vaping status: Never Used  Substance Use Topics   Alcohol use: Yes    Comment: Moonshine most recently.   Drug use: Not Currently     Allergies   Patient has no known allergies.   Review of Systems Review of Systems  Constitutional:  Negative for chills and fever.   Eyes:  Negative for discharge and redness.  Gastrointestinal:  Negative for abdominal pain, nausea and vomiting.  Musculoskeletal:  Positive for arthralgias. Negative for joint swelling.  Neurological:  Negative for numbness.     Physical Exam Triage Vital Signs ED Triage Vitals  Encounter Vitals Group     BP 10/17/23 1728 118/79     Systolic BP Percentile --      Diastolic BP Percentile --      Pulse Rate 10/17/23 1728 81     Resp 10/17/23 1728 16     Temp 10/17/23 1728 98.5 F (36.9 C)     Temp Source 10/17/23 1728 Oral     SpO2 10/17/23 1728 97 %     Weight --      Height --      Head Circumference --      Peak Flow --      Pain Score 10/17/23 1729 7     Pain Loc --      Pain Education --      Exclude from Growth Chart --    No data found.  Updated Vital Signs BP 118/79 (BP Location: Left Arm)   Pulse 81   Temp 98.5 F (36.9 C) (Oral)   Resp 16   SpO2 97%   Visual Acuity Right Eye Distance:   Left Eye Distance:   Bilateral Distance:    Right Eye Near:   Left Eye Near:    Bilateral Near:     Physical Exam Vitals and nursing note reviewed.  Constitutional:      General: He is not in acute distress.    Appearance: Normal appearance. He is not ill-appearing.  HENT:     Head: Normocephalic and atraumatic.  Eyes:     Conjunctiva/sclera: Conjunctivae normal.  Cardiovascular:     Rate and Rhythm: Normal rate.  Pulmonary:     Effort: Pulmonary effort is normal. No respiratory distress.  Musculoskeletal:     Comments: Normal range of motion of right wrist.  No apparent swelling or erythema  Neurological:     Mental Status: He is alert.  Psychiatric:        Mood and Affect: Mood normal.        Behavior: Behavior normal.        Thought Content: Thought content normal.      UC Treatments / Results  Labs (all labs ordered are listed, but only abnormal results are displayed) Labs Reviewed - No data to display  EKG   Radiology No results  found.  Procedures Procedures (including critical care time)  Medications Ordered in UC Medications - No data to display  Initial Impression / Assessment and Plan / UC Course  I have reviewed the triage vital signs and the nursing notes.  Pertinent labs & imaging results that were available during my care of the patient were reviewed by me and considered in my medical decision making (see chart for details).   X-ray without fracture.  Suspect likely inflammatory cause of symptoms from regular use and will treat with steroid burst.  Recommended follow-up with Ortho if no improvement or with any further concerns.   Final Clinical Impressions(s) / UC Diagnoses   Final diagnoses:  Right wrist pain   Discharge Instructions   None    ED Prescriptions     Medication Sig Dispense Auth. Provider   predniSONE (DELTASONE) 20 MG tablet Take 2 tablets (40 mg total) by mouth daily with breakfast for 5 days. 10 tablet Tomi Bamberger, PA-C      PDMP not reviewed this encounter.   Tomi Bamberger, PA-C 10/29/23 [phone]

## 2023-11-01 DIAGNOSIS — G8918 Other acute postprocedural pain: Secondary | ICD-10-CM | POA: Insufficient documentation

## 2023-11-20 ENCOUNTER — Ambulatory Visit: Payer: BC Managed Care – PPO

## 2023-12-06 ENCOUNTER — Encounter: Payer: Self-pay | Admitting: Family Medicine

## 2024-01-25 ENCOUNTER — Ambulatory Visit (INDEPENDENT_AMBULATORY_CARE_PROVIDER_SITE_OTHER): Payer: BC Managed Care – PPO | Admitting: Family Medicine

## 2024-01-25 ENCOUNTER — Encounter: Payer: Self-pay | Admitting: Family Medicine

## 2024-01-25 VITALS — BP 106/75 | HR 63 | Ht 63.0 in | Wt 170.4 lb

## 2024-01-25 DIAGNOSIS — R197 Diarrhea, unspecified: Secondary | ICD-10-CM

## 2024-01-25 DIAGNOSIS — Z789 Other specified health status: Secondary | ICD-10-CM

## 2024-01-25 MED ORDER — TESTOSTERONE CYPIONATE 200 MG/ML IM SOLN: INTRAMUSCULAR | 1 refills | Status: DC

## 2024-01-25 MED ORDER — BD DISP NEEDLE 25G X 1" MISC: 3 refills | Status: AC

## 2024-01-25 MED ORDER — BD LUER-LOCK SYRINGE 18G X 1-1/2" 3 ML MISC: 3 refills | Status: DC

## 2024-01-26 LAB — CBC
Hematocrit: 39.3 % (ref 34.0–46.6)
Hemoglobin: 12.8 g/dL (ref 11.1–15.9)
MCH: 30.4 pg (ref 26.6–33.0)
MCHC: 32.6 g/dL (ref 31.5–35.7)
MCV: 93 fL (ref 79–97)
Platelets: 307 10*3/uL (ref 150–450)
RBC: 4.21 x10E6/uL (ref 3.77–5.28)
RDW: 12.4 % (ref 11.7–15.4)
WBC: 5.5 10*3/uL (ref 3.4–10.8)

## 2024-01-26 LAB — COMPREHENSIVE METABOLIC PANEL
ALT: 11 [IU]/L (ref 0–32)
AST: 16 [IU]/L (ref 0–40)
Albumin: 4.4 g/dL (ref 4.0–5.0)
Alkaline Phosphatase: 63 [IU]/L (ref 44–121)
BUN/Creatinine Ratio: 22 (ref 9–23)
BUN: 18 mg/dL (ref 6–20)
Bilirubin Total: 0.2 mg/dL (ref 0.0–1.2)
CO2: 22 mmol/L (ref 20–29)
Calcium: 10.1 mg/dL (ref 8.7–10.2)
Chloride: 104 mmol/L (ref 96–106)
Creatinine, Ser: 0.81 mg/dL (ref 0.57–1.00)
Globulin, Total: 2.7 g/dL (ref 1.5–4.5)
Glucose: 80 mg/dL (ref 70–99)
Potassium: 4.7 mmol/L (ref 3.5–5.2)
Sodium: 139 mmol/L (ref 134–144)
Total Protein: 7.1 g/dL (ref 6.0–8.5)
eGFR: 101 mL/min/{1.73_m2} (ref >59)

## 2024-01-26 LAB — TESTOSTERONE: Testosterone: 8 ng/dL — ABNORMAL LOW (ref 13–71)

## 2024-01-26 LAB — LIPASE: Lipase: 31 U/L (ref 14–72)

## 2024-01-27 ENCOUNTER — Encounter: Payer: Self-pay | Admitting: Family Medicine

## 2024-01-27 DIAGNOSIS — R197 Diarrhea, unspecified: Secondary | ICD-10-CM | POA: Insufficient documentation

## 2024-01-27 NOTE — Progress Notes (Signed)
    CHIEF COMPLAINT / HPI:   Follow-up gender affirming hormone therapy.  Doing well.  Due for labs.  Having trouble getting needles at current pharmacy so we will asked to send to a different 1. 2.  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  Recently has been much worse.  Daily.  Loose stools.  He cannot decide if it is dietary related or what the triggers are but sometimes it is much worse than others.  No blood in stool.  Does have some chronic lower abdominal pain.  [place] Spaniel this has been going on a long time but now is starting to interfere with his daily activities.  PERTINENT  PMH / PSH: I have reviewed the patient's medications, allergies, past medical and surgical history, smoking status and updated in the EMR as appropriate.   OBJECTIVE:  BP 106/75   Pulse 63   Ht 5\' 3"  (1.6 m)   Wt 170 lb 6.4 oz (77.3 kg)   SpO2 99%   BMI 30.19 kg/m   GENERAL: Well-developed no acute distress ABDOMEN:: Soft, positive bowel sounds nondistended nontender no masses. PSYCH: AxOx4. Good eye contact.. No psychomotor retardation or agitation. Appropriate speech fluency and content. Asks and answers questions appropriately. Mood is congruent. Skin: Mild acne bilateral face ASSESSMENT / PLAN:   No problem-specific Assessment & Plan notes found for this encounter.   Denny Levy MD

## 2024-01-27 NOTE — Assessment & Plan Note (Signed)
 Doing well on current regimen we will continue.  Will check labs today and make any needed adjustments.  Will need a Programme researcher, broadcasting/film/video for potential surgical interventions.

## 2024-01-27 NOTE — Assessment & Plan Note (Signed)
 Sounds like this has been a longstanding problem.  Could be IBS or IBD as he is in the right age range for either.  Given upper and lower symptoms I think referral to gastroenterology is best recourse.  Discussed possible workup might include EGD/colonoscopy etc.  At this point he is willing to do all of that.  Will check few labs today as well.

## 2024-01-31 ENCOUNTER — Encounter: Payer: Self-pay | Admitting: Family Medicine

## 2024-02-08 ENCOUNTER — Ambulatory Visit: Payer: BC Managed Care – PPO | Admitting: Family Medicine

## 2024-02-11 ENCOUNTER — Encounter: Payer: Self-pay | Admitting: Family Medicine

## 2024-03-23 ENCOUNTER — Encounter: Payer: Self-pay | Admitting: Family Medicine

## 2024-04-05 ENCOUNTER — Ambulatory Visit: Admitting: Physician Assistant

## 2024-04-05 ENCOUNTER — Other Ambulatory Visit

## 2024-04-05 ENCOUNTER — Encounter: Payer: Self-pay | Admitting: Physician Assistant

## 2024-04-05 VITALS — BP 120/76 | HR 58 | Ht 63.0 in | Wt 168.0 lb

## 2024-04-05 DIAGNOSIS — K2100 Gastro-esophageal reflux disease with esophagitis, without bleeding: Secondary | ICD-10-CM

## 2024-04-05 DIAGNOSIS — R112 Nausea with vomiting, unspecified: Secondary | ICD-10-CM

## 2024-04-05 DIAGNOSIS — M545 Low back pain, unspecified: Secondary | ICD-10-CM

## 2024-04-05 DIAGNOSIS — R1030 Lower abdominal pain, unspecified: Secondary | ICD-10-CM

## 2024-04-05 DIAGNOSIS — G8929 Other chronic pain: Secondary | ICD-10-CM

## 2024-04-05 DIAGNOSIS — R197 Diarrhea, unspecified: Secondary | ICD-10-CM

## 2024-04-05 LAB — CBC WITH DIFFERENTIAL/PLATELET
Basophils Absolute: 0 10*3/uL (ref 0.0–0.1)
Basophils Relative: 0.6 % (ref 0.0–3.0)
Eosinophils Absolute: 0.1 10*3/uL (ref 0.0–0.7)
Eosinophils Relative: 1.7 % (ref 0.0–5.0)
HCT: 42.7 % (ref 36.0–46.0)
Hemoglobin: 14.2 g/dL (ref 12.0–15.0)
Lymphocytes Relative: 20.6 % (ref 12.0–46.0)
Lymphs Abs: 1.2 10*3/uL (ref 0.7–4.0)
MCHC: 33.2 g/dL (ref 30.0–36.0)
MCV: 91.7 fl (ref 78.0–100.0)
Monocytes Absolute: 0.4 10*3/uL (ref 0.1–1.0)
Monocytes Relative: 6.7 % (ref 3.0–12.0)
Neutro Abs: 4.3 10*3/uL (ref 1.4–7.7)
Neutrophils Relative %: 70.4 % (ref 43.0–77.0)
Platelets: 285 10*3/uL (ref 150.0–400.0)
RBC: 4.66 Mil/uL (ref 3.87–5.11)
RDW: 12.5 % (ref 11.5–15.5)
WBC: 6.1 10*3/uL (ref 4.0–10.5)

## 2024-04-05 LAB — COMPREHENSIVE METABOLIC PANEL WITH GFR
ALT: 10 U/L (ref 0–35)
AST: 13 U/L (ref 0–37)
Albumin: 4.6 g/dL (ref 3.5–5.2)
Alkaline Phosphatase: 49 U/L (ref 39–117)
BUN: 13 mg/dL (ref 6–23)
CO2: 27 meq/L (ref 19–32)
Calcium: 9.9 mg/dL (ref 8.4–10.5)
Chloride: 104 meq/L (ref 96–112)
Creatinine, Ser: 0.81 mg/dL (ref 0.40–1.20)
GFR: 98.87 mL/min (ref >60.00)
Glucose, Bld: 87 mg/dL (ref 70–99)
Potassium: 4.2 meq/L (ref 3.5–5.1)
Sodium: 138 meq/L (ref 135–145)
Total Bilirubin: 0.4 mg/dL (ref 0.2–1.2)
Total Protein: 7.6 g/dL (ref 6.0–8.3)

## 2024-04-05 LAB — SEDIMENTATION RATE: Sed Rate: 5 mm/h (ref 0–20)

## 2024-04-05 LAB — HIGH SENSITIVITY CRP: CRP, High Sensitivity: 1.63 mg/L (ref 0.000–5.000)

## 2024-04-05 LAB — TSH: TSH: 2.03 u[IU]/mL (ref 0.35–5.50)

## 2024-04-05 MED ORDER — OSCIMIN 0.125 MG SL SUBL: 0.1250 mg | SUBLINGUAL_TABLET | Freq: Four times a day (QID) | SUBLINGUAL | 1 refills | Status: AC | PRN

## 2024-04-05 MED ORDER — NA SULFATE-K SULFATE-MG SULF 17.5-3.13-1.6 GM/177ML PO SOLN: 1.0000 | Freq: Once | ORAL | 0 refills | Status: AC

## 2024-04-05 MED ORDER — ONDANSETRON 4 MG PO TBDP: 4.0000 mg | ORAL_TABLET | Freq: Three times a day (TID) | ORAL | 0 refills | Status: AC | PRN

## 2024-04-05 NOTE — Patient Instructions (Addendum)
 Your provider has requested that you go to the basement level for lab work before leaving today. Press "B" on the elevator. The lab is located at the first door on the left as you exit the elevator.  We are getting EGD/Colon to rule out inflammatory bowel disease  First do a trial off milk/lactose products if you use them.  Add fiber like benefiber or citracel once a day Increase activity Can send in an anti spasm medication, Levsin, to take as needed   FODMAP stands for fermentable oligo-, di-, mono-saccharides and polyols (1). These are the scientific terms used to classify groups of carbs that are difficult for our body to digest and that are notorious for triggering digestive symptoms like bloating, gas, loose stools and stomach pain.   You can try low FODMAP diet  - start with eliminating just one column at a time that you feel may be a trigger for you. - the table at the very bottom contains foods that are low in FODMAPs   Sometimes trying to eliminate the FODMAP's from your diet is difficult or tricky, if you are stuggling with trying to do the elimination diet you can try an enzyme.  There is a food enzymes that you sprinkle in or on your food that helps break down the FODMAP. You can read more about the enzyme by going to this site: https://fodzyme.com/   Due to recent changes in healthcare laws, you may see the results of your imaging and laboratory studies on MyChart before your provider has had a chance to review them.  We understand that in some cases there may be results that are confusing or concerning to you. Not all laboratory results come back in the same time frame and the provider may be waiting for multiple results in order to interpret others.  Please give us  48 hours in order for your provider to thoroughly review all the results before contacting the office for clarification of your results.    I appreciate the  opportunity to care for you  Thank You   [facility]

## 2024-04-05 NOTE — Progress Notes (Addendum)
 04/05/2024 Tina Walsh [id number] February 25, 1996  Referring provider: Charise Companion, MD Primary GI doctor: Dr. Yvone Herd  ASSESSMENT AND PLAN:  Diarrhea, nausea, vomiting with associated lower abdominal pain, possible tenesmus Maternal GM history of colon CA, mother with thyroid but patient not close to his family  01/25/2024 CBC no anemia no leukocytosis, normal kidney liver, lipase negative For 6 + years, will have diarrhea, vomiting, AB pain that will wake him from his sleep and can last 24-48 hours, can have nose bleeds associated with it No associated weight loss, can have small volume BRB on TP Symptoms very concerning for IBD -Get Sed rate, CRP -Check celiac panel - with back pain worse at rest, will get HLA-B27, consider xray for SI -Pending labs, will schedule for CT AB and pelvis -Can do trial of IBGARD daily, will give Levsin as needed in the mean time  FODMAP,  and lifestyle changes discussed -Will schedule for endoscopic evaluation, discussed with patient and agrees with plan. Risk of bowel prep, conscious sedation, and EGD and colonoscopy were discussed.  Risks include but are not limited to dehydration, pain, bleeding, cardiopulmonary process, bowel perforation, or other possible adverse outcomes.Treatment plan was discussed with patient, and agreed upon.   GERD with nausea and vomiting Was on Pepcid  and Prilosec, off of it Chews tobacco and this worsens symptoms, rare NSAIDS for HA, no ETOH, no drug use  Lower back pain worse at rest - get labs - get HLAB27 -Consider Xray  I have reviewed the clinic note as outlined by Santina Cull, PA and agree with the assessment, plan and medical decision making.  Tina Walsh presents to the office today for evaluation and management of his 6-year history of episodic abdominal pain and diarrhea.  Symptoms often occur in the middle of the night.  No melena.  Occasional bright red blood with wiping.  Reports heartburn and previously  used Pepcid  and Prilosec.  I agree with laboratory evaluation and proceeding with EGD and colonoscopy.  Eugenia Hess, MD    Patient Care Team: Charise Companion, MD as PCP - General (Family Medicine)  HISTORY OF PRESENT ILLNESS: 28 y.o. adult female with a past medical history listed below presents as a new patient for evaluation of diarrhea.   Discussed the use of AI scribe software for clinical note transcription with the patient, who gave verbal consent to proceed.  History of Present Illness   Tina Walsh "Tina Walsh" is a 28 year old female who presents with chronic episodes of abdominal discomfort and diarrhea.  He has experienced episodes of abdominal discomfort and diarrhea for over six years, occurring every couple of months. These episodes are described as feeling like a 'stomach bug,' with symptoms including vomiting and diarrhea 'coming out of both ends' until he has 'nothing left.' These episodes often occur in the middle of the night, causing him to miss work the following day. Severe lower abdominal pain is noted during these episodes.  His weight fluctuates between 168 and 170 pounds, with no significant weight loss. He denies blood in his stool, although he occasionally notices small amounts of bright red blood when wiping. Nausea that wakes him up is followed by vomiting and diarrhea, which starts as a normal bowel movement and then turns to water. He sometimes feels the urge to have a bowel movement but cannot pass anything.  He has a family history of gastrointestinal issues, with his grandmother having had colon cancer and dietary restrictions due to  other stomach issues. His mother has thyroid issues and takes multiple medications, including for blood pressure.  He has a history of heartburn and has previously used Pepcid  and Prilosec. Heartburn is regular and associated with his use of chewing tobacco. He occasionally takes ibuprofen for severe headaches experienced at  work.  Socially, he does not consume alcohol or use drugs, including marijuana. He drinks sweet tea with meals and water otherwise, and consumes two Red Bulls daily. He has considered the possibility of lactose intolerance, noting that consuming cereal or cream in coffee can cause discomfort.  In terms of bowel habits between episodes, he describes them as generally normal, though sometimes more diarrhea-like than expected. He typically has one or two bowel movements a day, depending on his diet.  He experiences joint pain, particularly in his knees and lower back, which is worse when sitting and somewhat relieved by movement. He has a numb spot on his back but no numbness or tingling down his legs.      He  reports that he has never smoked. His smokeless tobacco use includes chew. He reports current alcohol use. He reports that he does not currently use drugs.  RELEVANT GI HISTORY, IMAGING AND LABS: Results   LABS CBC: No anemia, WBC within normal limits (12/2023)      CBC    Component Value Date/Time   WBC 5.5 01/25/2024 1052   WBC 8.8 04/14/2016 2151   RBC 4.21 01/25/2024 1052   RBC 3.87 04/14/2016 2151   HGB 12.8 01/25/2024 1052   HCT 39.3 01/25/2024 1052   PLT 307 01/25/2024 1052   MCV 93 01/25/2024 1052   MCH 30.4 01/25/2024 1052   MCH 28.9 04/14/2016 2151   MCHC 32.6 01/25/2024 1052   MCHC 33.3 04/14/2016 2151   RDW 12.4 01/25/2024 1052   LYMPHSABS 1.5 04/14/2016 2151   MONOABS 0.5 04/14/2016 2151   EOSABS 0.2 04/14/2016 2151   BASOSABS 0.0 04/14/2016 2151   Recent Labs    04/06/23 1239 01/25/24 1052  HGB 13.6 12.8    CMP     Component Value Date/Time   NA 139 01/25/2024 1052   K 4.7 01/25/2024 1052   CL 104 01/25/2024 1052   CO2 22 01/25/2024 1052   GLUCOSE 80 01/25/2024 1052   GLUCOSE 105 (H) 04/14/2016 2151   BUN 18 01/25/2024 1052   CREATININE 0.81 01/25/2024 1052   CALCIUM 10.1 01/25/2024 1052   PROT 7.1 01/25/2024 1052   ALBUMIN 4.4 01/25/2024  1052   AST 16 01/25/2024 1052   ALT 11 01/25/2024 1052   ALKPHOS 63 01/25/2024 1052   BILITOT 0.2 01/25/2024 1052   GFRNONAA >60 04/14/2016 2151   GFRAA >60 04/14/2016 2151      Latest Ref Rng & Units 01/25/2024   10:52 AM 08/13/2013    9:17 AM  Hepatic Function  Total Protein 6.0 - 8.5 g/dL 7.1  7.5   Albumin 4.0 - 5.0 g/dL 4.4  4.1   AST 0 - 40 IU/L 16  13   ALT 0 - 32 IU/L 11  7   Alk Phosphatase 44 - 121 IU/L 63  57   Total Bilirubin 0.0 - 1.2 mg/dL 0.2  0.2       Current Medications:   Current Outpatient Medications (Endocrine & Metabolic):    testosterone  cypionate (DEPOTESTOSTERONE CYPIONATE) 200 MG/ML injection, Inject 0.2 ml whichis 40 mg under skin as directed weekly   Current Outpatient Medications (Respiratory):    cetirizine  (ZYRTEC )  10 MG tablet, Take 1 tablet (10 mg total) by mouth daily for 10 days.   fluticasone  (FLONASE ) 50 MCG/ACT nasal spray, Place 1 spray into both nostrils daily for 3 days.  Current Outpatient Medications (Analgesics):    meloxicam  (MOBIC ) 7.5 MG tablet, Take 1 tablet (7.5 mg total) by mouth daily.   Current Outpatient Medications (Other):    erythromycin  with ethanol (EMGEL ) 2 % gel, Apply topically daily.   famotidine  (PEPCID ) 20 MG tablet, Take 20 mg by mouth daily.   hyoscyamine (OSCIMIN) 0.125 MG SL tablet, Take 1 tablet (0.125 mg total) by mouth every 6 (six) hours as needed for cramping (diarrhea).   Na Sulfate-K Sulfate-Mg Sulfate concentrate (SUPREP) 17.5-3.13-1.6 GM/177ML SOLN, Take 1 kit (354 mLs total) by mouth once for 1 dose.   NEEDLE, DISP, 18 G (BD BLUNT FILTER NEEDLE) 18G X 1-1/2" MISC, Use as directed for testosterone  injection   NEEDLE, DISP, 23 G 23G X 1" MISC, 1 Units by Does not apply route once a week.   NEEDLE, DISP, 25 G (B-D DISP NEEDLE 25GX1") 25G X 1" MISC, Use weekly as directed to inject testosterone  under skin as directed   omeprazole  (PRILOSEC) 20 MG capsule, Take 20 mg by mouth daily.   Syringe,  Disposable, (B-D SYRINGE LUER-LOK 1CC) 1 ML MISC, Use as directed with testosterone  injection   SYRINGE-NEEDLE, DISP, 3 ML (BD LUER-LOCK SYRINGE) 18G X 1-1/2" 3 ML MISC, Use as directed   ondansetron  (ZOFRAN -ODT) 4 MG disintegrating tablet, Take 1 tablet (4 mg total) by mouth every 8 (eight) hours as needed for nausea or vomiting.  Medical History: History reviewed. No pertinent past medical history. Allergies: No Known Allergies   Surgical History:  He  has a past surgical history that includes I & D extremity (Right, 04/14/2016). Family History:  His family history includes Colon cancer in his maternal grandmother; Hypertension in his father and mother.  REVIEW OF SYSTEMS  : All other systems reviewed and negative except where noted in the History of Present Illness.  PHYSICAL EXAM: BP 120/76   Pulse (!) 58   Ht 5\' 3"  (1.6 m)   Wt 168 lb (76.2 kg)   BMI 29.76 kg/m  Physical Exam   MEASUREMENTS: Weight- 168. GENERAL APPEARANCE: Well nourished, in no apparent distress. HEENT: No cervical lymphadenopathy, unremarkable thyroid, sclerae anicteric, conjunctiva pink. RESPIRATORY: Respiratory effort normal, breath sounds clear to auscultation bilaterally without rales, rhonchi, or wheezing. CARDIO: Regular rate and rhythm with no murmurs, rubs, or gallops, peripheral pulses intact. ABDOMEN: Soft, non-distended, active bowel sounds in all four quadrants, mild discomfort in right lower quadrant, no rebound tenderness, no mass appreciated. RECTAL: Declines. MUSCULOSKELETAL: Full range of motion, normal gait, without edema. SKIN: Dry, intact without rashes or lesions. No jaundice. NEURO: Alert, oriented, no focal deficits. PSYCH: Cooperative, normal mood and affect.      Edmonia Gottron, PA-C 9:19 AM

## 2024-04-09 LAB — IGA: Immunoglobulin A: 403 mg/dL — ABNORMAL HIGH (ref 47–310)

## 2024-04-09 LAB — HLA-B27 ANTIGEN: HLA-B27 Antigen: POSITIVE — AB

## 2024-04-09 LAB — TISSUE TRANSGLUTAMINASE, IGA: (tTG) Ab, IgA: <1 U/mL

## 2024-04-10 ENCOUNTER — Ambulatory Visit: Payer: Self-pay | Admitting: Physician Assistant

## 2024-04-10 DIAGNOSIS — M545 Other chronic pain: Secondary | ICD-10-CM

## 2024-05-02 ENCOUNTER — Encounter: Payer: Self-pay | Admitting: Pediatrics

## 2024-05-08 ENCOUNTER — Encounter: Payer: Self-pay | Admitting: Emergency Medicine

## 2024-05-08 ENCOUNTER — Ambulatory Visit: Admission: EM | Admit: 2024-05-08 | Discharge: 2024-05-08 | Disposition: A | Discharge status: Home / Self Care

## 2024-05-08 DIAGNOSIS — J029 Acute pharyngitis, unspecified: Secondary | ICD-10-CM

## 2024-05-08 MED ORDER — AMOXICILLIN 500 MG PO CAPS: 500.0000 mg | ORAL_CAPSULE | Freq: Three times a day (TID) | ORAL | 0 refills | Status: DC

## 2024-05-08 NOTE — ED Provider Notes (Signed)
 [name]    CSN: [id number] Arrival date & time: 05/08/24  1418      History   Chief Complaint Chief Complaint  Patient presents with   Sore Throat   Otalgia   Cough    HPI Tina Walsh is a 28 y.o. adult.   Patient complains of a sore throat.  Patient has a history of getting tonsil stones.  Patient reports sore throat began 2 weeks ago was getting better but has now returned.  Patient reports having a child who has also been sick.  Patient denies any fever or chills no cough or congestion.  No sinus symptoms  The history is provided by the patient. No language interpreter was used.  Sore Throat This is a new problem. The problem has been gradually worsening. Nothing aggravates the symptoms. Nothing relieves the symptoms.  Otalgia Associated symptoms: cough   Cough Associated symptoms: ear pain     History reviewed. No pertinent past medical history.  Patient Active Problem List   Diagnosis Date Noted   Diarrhea 01/27/2024   Tonsil stone 02/03/2022   Acne nodule 09/07/2018   Severe episode of recurrent major depressive disorder, without psychotic features (HCC) 09/07/2018   Female-to-female transgender person 09/07/2018   Gastroesophageal reflux disease 09/07/2018   Closed displaced fracture of styloid process of right ulna with routine healing 04/20/2016   Closed intra-articular die-punch fracture of radius with routine healing 04/20/2016   Superficial foreign body of right hand 04/20/2016    Past Surgical History:  Procedure Laterality Date   I & D EXTREMITY Right 04/14/2016   Procedure: REMOVAL FOREIGN BODY RIGHT HAND;  Surgeon: Florida Hurter, MD;  Location: [facility] OR;  Service: Orthopedics;  Laterality: Right;    OB History   No obstetric history on file.      Home Medications    Prior to Admission medications   Medication Sig Start Date End Date Taking? Authorizing Provider  amoxicillin  (AMOXIL ) 500 MG capsule Take 1 capsule (500 mg  total) by mouth 3 (three) times daily. 05/08/24  Yes Adelaida Reindel K, PA-C  HYDROcodone-acetaminophen  (NORCO/VICODIN) 5-325 MG tablet Take 1 tablet every 6 hours by oral route. 11/01/23  Yes [provider]  cetirizine  (ZYRTEC ) 10 MG tablet Take 1 tablet (10 mg total) by mouth daily for 10 days. 09/23/21 10/03/21  Dodson Freestone, FNP  erythromycin  with ethanol (EMGEL ) 2 % gel Apply topically daily. 06/13/23 06/12/24  Charise Companion, MD  famotidine  (PEPCID ) 20 MG tablet Take 20 mg by mouth daily. 03/21/16   [provider]  fluticasone  (FLONASE ) 50 MCG/ACT nasal spray Place 1 spray into both nostrils daily for 3 days. 09/23/21 09/26/21  Dodson Freestone, FNP  hyoscyamine  (OSCIMIN ) 0.125 MG SL tablet Take 1 tablet (0.125 mg total) by mouth every 6 (six) hours as needed for cramping (diarrhea). 04/05/24   Edmonia Gottron, PA-C  meloxicam  (MOBIC ) 7.5 MG tablet Take 1 tablet (7.5 mg total) by mouth daily. 04/13/22   Rising, Rebecca, PA-C  NEEDLE, DISP, 18 G (BD BLUNT FILTER NEEDLE) 18G X 1-1/2" MISC Use as directed for testosterone  injection 10/11/23   Charise Companion, MD  NEEDLE, DISP, 23 G 23G X 1" MISC 1 Units by Does not apply route once a week. 10/11/23   Charise Companion, MD  NEEDLE, DISP, 25 G (B-D DISP NEEDLE 25GX1") 25G X 1" MISC Use weekly as directed to inject testosterone  under skin as directed 01/25/24   Charise Companion,  MD  omeprazole  (PRILOSEC) 20 MG capsule Take 20 mg by mouth daily.    [provider]  ondansetron  (ZOFRAN -ODT) 4 MG disintegrating tablet Take 1 tablet (4 mg total) by mouth every 8 (eight) hours as needed for nausea or vomiting. 04/05/24   Edmonia Gottron, PA-C  Syringe, Disposable, (B-D SYRINGE LUER-LOK 1CC) 1 ML MISC Use as directed with testosterone  injection 12/01/22   Charise Companion, MD  SYRINGE-NEEDLE, DISP, 3 ML (BD LUER-LOCK SYRINGE) 18G X 1-1/2" 3 ML MISC Use as directed 01/25/24   Charise Companion, MD  testosterone  cypionate (DEPOTESTOSTERONE CYPIONATE) 200 MG/ML  injection Inject 0.2 ml whichis 40 mg under skin as directed weekly 01/25/24   Charise Companion, MD    Family History Family History  Problem Relation Age of Onset   Hypertension Mother    Hypertension Father    Colon cancer Maternal Grandmother    Liver cancer Neg Hx    Esophageal cancer Neg Hx     Social History Social History   Tobacco Use   Smoking status: Never    Passive exposure: Current   Smokeless tobacco: Current    Types: Chew  Vaping Use   Vaping status: Never Used  Substance Use Topics   Alcohol use: Yes    Comment: Moonshine most recently.   Drug use: Not Currently     Allergies   Patient has no known allergies.   Review of Systems Review of Systems  HENT:  Positive for ear pain.   Respiratory:  Positive for cough.   All other systems reviewed and are negative.    Physical Exam Triage Vital Signs ED Triage Vitals  Encounter Vitals Group     BP 05/08/24 1602 (!) 104/57     Systolic BP Percentile --      Diastolic BP Percentile --      Pulse Rate 05/08/24 1602 (!) 57     Resp 05/08/24 1602 18     Temp 05/08/24 1602 98.1 F (36.7 C)     Temp Source 05/08/24 1602 Oral     SpO2 05/08/24 1602 98 %     Weight 05/08/24 1601 167 lb 15.9 oz (76.2 kg)     Height --      Head Circumference --      Peak Flow --      Pain Score 05/08/24 1559 4     Pain Loc --      Pain Education --      Exclude from Growth Chart --    No data found.  Updated Vital Signs BP (!) 104/57 (BP Location: Right Arm)   Pulse (!) 57   Temp 98.1 F (36.7 C) (Oral)   Resp 18   Wt 76.2 kg   SpO2 98%   BMI 29.76 kg/m   Visual Acuity Right Eye Distance:   Left Eye Distance:   Bilateral Distance:    Right Eye Near:   Left Eye Near:    Bilateral Near:     Physical Exam Vitals and nursing note reviewed.  Constitutional:      Appearance: He is well-developed.  HENT:     Head: Normocephalic.     Mouth/Throat:     Mouth: Mucous membranes are moist.     Tonsils: No  tonsillar exudate.  Eyes:     Conjunctiva/sclera: Conjunctivae normal.  Cardiovascular:     Rate and Rhythm: Normal rate.  Pulmonary:     Effort: Pulmonary effort is normal.  Abdominal:  General: There is no distension.  Musculoskeletal:        General: Normal range of motion.     Cervical back: Normal range of motion.  Neurological:     Mental Status: He is alert and oriented to person, place, and time.  Psychiatric:        Mood and Affect: Mood normal.      UC Treatments / Results  Labs (all labs ordered are listed, but only abnormal results are displayed) Labs Reviewed - No data to display  EKG   Radiology No results found.  Procedures Procedures (including critical care time)  Medications Ordered in UC Medications - No data to display  Initial Impression / Assessment and Plan / UC Course  I have reviewed the triage vital signs and the nursing notes.  Pertinent labs & imaging results that were available during my care of the patient were reviewed by me and considered in my medical decision making (see chart for details).      Final Clinical Impressions(s) / UC Diagnoses   Final diagnoses:  Acute pharyngitis, unspecified etiology   Discharge Instructions      Return if any problems.   ED Prescriptions     Medication Sig Dispense Auth. Provider   amoxicillin  (AMOXIL ) 500 MG capsule Take 1 capsule (500 mg total) by mouth 3 (three) times daily. 30 capsule Asusena Sigley K, PA-C      PDMP not reviewed this encounter. An After Visit Summary was printed and given to the patient.       Sandi Crosby, PA-C 05/08/24 1705

## 2024-05-08 NOTE — ED Triage Notes (Signed)
 Pt presents c/o sore throat, cough, and otalgia x 2 days. Pt denies emesis.

## 2024-05-08 NOTE — Discharge Instructions (Addendum)
 Return if any problems.

## 2024-05-09 ENCOUNTER — Encounter: Admitting: Pediatrics

## 2024-06-04 DIAGNOSIS — M25531 Pain in right wrist: Secondary | ICD-10-CM | POA: Insufficient documentation

## 2024-06-05 DIAGNOSIS — M65939 Unspecified synovitis and tenosynovitis, unspecified forearm: Secondary | ICD-10-CM | POA: Insufficient documentation

## 2024-07-29 ENCOUNTER — Emergency Department (HOSPITAL_COMMUNITY)
Admission: EM | Admit: 2024-07-29 | Discharge: 2024-07-30 | Disposition: A | Discharge status: Home / Self Care | Attending: Emergency Medicine | Admitting: Emergency Medicine

## 2024-07-29 ENCOUNTER — Encounter (HOSPITAL_COMMUNITY): Payer: Self-pay

## 2024-07-29 DIAGNOSIS — S6992XA Unspecified injury of left wrist, hand and finger(s), initial encounter: Secondary | ICD-10-CM | POA: Diagnosis present

## 2024-07-29 DIAGNOSIS — Z23 Encounter for immunization: Secondary | ICD-10-CM | POA: Diagnosis not present

## 2024-07-29 DIAGNOSIS — W260XXA Contact with knife, initial encounter: Secondary | ICD-10-CM | POA: Insufficient documentation

## 2024-07-29 DIAGNOSIS — S61211A Laceration without foreign body of left index finger without damage to nail, initial encounter: Secondary | ICD-10-CM | POA: Diagnosis not present

## 2024-07-29 DIAGNOSIS — T148XXA Other injury of unspecified body region, initial encounter: Secondary | ICD-10-CM

## 2024-07-29 NOTE — ED Triage Notes (Signed)
 POV, Pt had accidental cut on index finger of left hand, pt was cutting toy zip-tie and knife slip. Happened around 1200, Pretty deep. Bleeding controled .  Aox4, pain 4 out 10.

## 2024-07-30 ENCOUNTER — Emergency Department (HOSPITAL_COMMUNITY)

## 2024-07-30 MED ORDER — IBUPROFEN 800 MG PO TABS
800.0000 mg | ORAL_TABLET | Freq: Once | ORAL | Status: AC
  Administered 2024-07-30: 800 mg via ORAL
  Filled 2024-07-30: qty 1

## 2024-07-30 MED ORDER — ACETAMINOPHEN 325 MG PO TABS
650.0000 mg | ORAL_TABLET | Freq: Once | ORAL | Status: AC
  Administered 2024-07-30: 650 mg via ORAL
  Filled 2024-07-30: qty 2

## 2024-07-30 MED ORDER — TETANUS-DIPHTH-ACELL PERTUSSIS 5-2.5-18.5 LF-MCG/0.5 IM SUSY
0.5000 mL | PREFILLED_SYRINGE | Freq: Once | INTRAMUSCULAR | Status: AC
  Administered 2024-07-30: 0.5 mL via INTRAMUSCULAR
  Filled 2024-07-30: qty 0.5

## 2024-07-30 NOTE — ED Notes (Signed)
 Patient transported to X-ray

## 2024-07-30 NOTE — Discharge Instructions (Addendum)
 As discussed, work appears reassuring.  No signs of tendon damage.  Dermabond will flake off over the next 10 days.  Reviewed guidance concerning nonsutured laceration care.  Take ibuprofen  for pain.  Follow-up with PCP.  Return to ED with new symptoms.

## 2024-07-30 NOTE — ED Notes (Signed)
 Patient d/c with home care instructions.

## 2024-07-30 NOTE — ED Provider Notes (Signed)
 [place] [facility] AT [facility] Provider Note   CSN: [id number] Arrival date & time: 07/29/24  2223     Patient presents with: No chief complaint on file.   Tina Walsh is a 28 y.o. adult with history of female transgender, GERD, diarrhea and tonsil stones.  Patient presents to ED for evaluation of injury to left index finger.  Reports occurred tonight while trying to open a zip tie.  States this occurred with a knife.  Unsure of last tetanus update.  States it is painful.  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  Denies any issues with forming a fist.  HPI     Prior to Admission medications   Medication Sig Start Date End Date Taking? Authorizing Provider  amoxicillin  (AMOXIL ) 500 MG capsule Take 1 capsule (500 mg total) by mouth 3 (three) times daily. 05/08/24   Sofia, Leslie K, PA-C  cetirizine  (ZYRTEC ) 10 MG tablet Take 1 tablet (10 mg total) by mouth daily for 10 days. 09/23/21 10/03/21  Hazen Darryle BRAVO, FNP  erythromycin  with ethanol (EMGEL ) 2 % gel Apply topically daily. 06/13/23 06/12/24  Rosalynn Camie LITTIE, MD  famotidine  (PEPCID ) 20 MG tablet Take 20 mg by mouth daily. 03/21/16   [provider]  fluticasone  (FLONASE ) 50 MCG/ACT nasal spray Place 1 spray into both nostrils daily for 3 days. 09/23/21 09/26/21  Mound, Haley E, FNP  HYDROcodone-acetaminophen  (NORCO/VICODIN) 5-325 MG tablet Take 1 tablet every 6 hours by oral route. 11/01/23   [provider]  hyoscyamine  (OSCIMIN ) 0.125 MG SL tablet Take 1 tablet (0.125 mg total) by mouth every 6 (six) hours as needed for cramping (diarrhea). 04/05/24   Craig Alan SAUNDERS, PA-C  meloxicam  (MOBIC ) 7.5 MG tablet Take 1 tablet (7.5 mg total) by mouth daily. 04/13/22   Rising, Rebecca, PA-C  NEEDLE, DISP, 18 G (BD BLUNT FILTER NEEDLE) 18G X 1-1/2 MISC Use as directed for testosterone  injection 10/11/23   Rosalynn Camie LITTIE, MD  NEEDLE, DISP, 23 G 23G X 1 MISC 1 Units by Does not apply route once a week.  10/11/23   Rosalynn Camie LITTIE, MD  NEEDLE, DISP, 25 G (B-D DISP NEEDLE 25GX1) 25G X 1 MISC Use weekly as directed to inject testosterone  under skin as directed 01/25/24   Rosalynn Camie LITTIE, MD  omeprazole  (PRILOSEC) 20 MG capsule Take 20 mg by mouth daily.    [provider]  ondansetron  (ZOFRAN -ODT) 4 MG disintegrating tablet Take 1 tablet (4 mg total) by mouth every 8 (eight) hours as needed for nausea or vomiting. 04/05/24   Craig Alan SAUNDERS, PA-C  Syringe, Disposable, (B-D SYRINGE LUER-LOK 1CC) 1 ML MISC Use as directed with testosterone  injection 12/01/22   Rosalynn Camie LITTIE, MD  SYRINGE-NEEDLE, DISP, 3 ML (BD LUER-LOCK SYRINGE) 18G X 1-1/2 3 ML MISC Use as directed 01/25/24   Rosalynn Camie LITTIE, MD  testosterone  cypionate (DEPOTESTOSTERONE CYPIONATE) 200 MG/ML injection Inject 0.2 ml whichis 40 mg under skin as directed weekly 01/25/24   Rosalynn Camie LITTIE, MD    Allergies: Patient has no known allergies.    Review of Systems  Skin:  Positive for wound.    Updated Vital Signs BP 129/82   Pulse 71   Temp 98.6 F (37 C) (Oral)   Resp 16   SpO2 98%   Physical Exam Vitals and nursing note reviewed.  Constitutional:      General: He is not in acute distress.    Appearance: He is  well-developed.  HENT:     Head: Normocephalic and atraumatic.  Eyes:     Conjunctiva/sclera: Conjunctivae normal.  Cardiovascular:     Rate and Rhythm: Normal rate and regular rhythm.     Heart sounds: No murmur heard. Pulmonary:     Effort: Pulmonary effort is normal. No respiratory distress.     Breath sounds: Normal breath sounds.  Abdominal:     Palpations: Abdomen is soft.     Tenderness: There is no abdominal tenderness.  Musculoskeletal:        General: No swelling.     Cervical back: Neck supple.  Skin:    General: Skin is warm and dry.     Capillary Refill: Capillary refill takes less than 2 seconds.     Comments: 1 cm superficial abrasion to the left index finger.  Bleeding controlled.  Neurological:      Mental Status: He is alert.  Psychiatric:        Mood and Affect: Mood normal.     (all labs ordered are listed, but only abnormal results are displayed) Labs Reviewed - No data to display  EKG: None  Radiology: DG Finger Index Left Result Date: 07/30/2024 CLINICAL DATA:  Trauma, cut by mouth. EXAM: LEFT INDEX FINGER 2+V COMPARISON:  None Available. FINDINGS: There is no evidence of fracture or dislocation. There is no evidence of arthropathy or other focal bone abnormality. Soft tissues are unremarkable. IMPRESSION: Negative. Electronically Signed   By: Greig Pique M.D.   On: 07/30/2024 01:29    Procedures   Medications Ordered in the ED  Tdap (BOOSTRIX ) injection 0.5 mL (0.5 mLs Intramuscular Given 07/30/24 0136)  ibuprofen  (ADVIL ) tablet 800 mg (800 mg Oral Given 07/30/24 0136)  acetaminophen  (TYLENOL ) tablet 650 mg (650 mg Oral Given 07/30/24 0135)      Medical Decision Making Amount and/or Complexity of Data Reviewed Radiology: ordered.  Risk OTC drugs. Prescription drug management.   This is a 28 year old individual who presents to the ED out of concern of injury to left index finger.  On exam, hemodynamically stable.  Patient has superficial abrasion to left index finger which has controlled bleeding.  This is superficial.  X-ray imaging obtained shows no foreign bodies or underlying injury or fracture.  Patient has full range of motion of left index finger so I doubt tendon injury.  Tetanus updated.  Given Tylenol  for pain.  Dermabond applied to wound.  Patient discharged home at this time, advised to follow-up with PCP.  Return precautions given.  Stable to discharge.    Final diagnoses:  Superficial laceration    ED Discharge Orders     None          Dyami Umbach F, PA-C 07/30/24 0213    Bero, Michael M, MD 07/30/24 [phone]

## 2024-09-27 ENCOUNTER — Ambulatory Visit
Admission: RE | Admit: 2024-09-27 | Discharge: 2024-09-27 | Disposition: A | Discharge status: Home / Self Care | Payer: Self-pay | Source: Ambulatory Visit

## 2024-09-27 VITALS — BP 121/79 | HR 75 | Temp 98.3°F | Resp 14

## 2024-09-27 DIAGNOSIS — J0100 Acute maxillary sinusitis, unspecified: Secondary | ICD-10-CM

## 2024-09-27 HISTORY — DX: Other specified health status: Z78.9

## 2024-09-27 MED ORDER — AMOXICILLIN-POT CLAVULANATE 875-125 MG PO TABS: 1.0000 | ORAL_TABLET | Freq: Two times a day (BID) | ORAL | 0 refills | Status: AC

## 2024-09-27 MED ORDER — PROMETHAZINE-DM 6.25-15 MG/5ML PO SYRP: 10.0000 mL | ORAL_SOLUTION | Freq: Three times a day (TID) | ORAL | 0 refills | Status: AC | PRN

## 2024-09-27 MED ORDER — AZELASTINE HCL 0.1 % NA SOLN: 1.0000 | Freq: Two times a day (BID) | NASAL | 1 refills | Status: AC

## 2024-09-27 NOTE — Discharge Instructions (Signed)
  1. Acute non-recurrent maxillary sinusitis (Primary) - amoxicillin -clavulanate (AUGMENTIN) 875-125 MG tablet; Take 1 tablet by mouth every 12 (twelve) hours.  Dispense: 14 tablet; Refill: 0 - azelastine (ASTELIN) 0.1 % nasal spray; Place 1 spray into both nostrils 2 (two) times daily. Use in each nostril as directed  Dispense: 30 mL; Refill: 1 - promethazine-dextromethorphan (PROMETHAZINE-DM) 6.25-15 MG/5ML syrup; Take 10 mLs by mouth 3 (three) times daily as needed.  Dispense: 240 mL; Refill: 0 -Continue to monitor symptoms for any change in severity if there is any escalation of current symptoms or development of new symptoms follow-up in ER for further evaluation and management.

## 2024-09-27 NOTE — ED Triage Notes (Signed)
 Pt reports productive cough (green sputum),  sore throat, nasal congestion, sinus pain, and ear fullness x 1week. Denies SOB, chest pain, and dizziness. Has taken equate cold/flu otc with no relief. Reports his daughter was sick recently as well, but getting better. No at home testing completed.

## 2024-09-27 NOTE — ED Provider Notes (Signed)
 [facility]  Note:  This document was prepared using Conservation officer, historic buildings and may include unintentional dictation errors.  MRN: [id number] DOB: 1996/07/04  Subjective:   Tina Walsh is a 28 y.o. adult presenting for productive cough, sore throat, nasal congestion, sinus pressure and pain, ear fullness x 1 week.  Patient reports that daughter has been sick with similar symptoms prior to the onset of his symptoms.  Patient has been using over-the-counter cough and cold medication with minimal improvement to symptoms.  Denies any home testing prior to arrival today.  Patient denies shortness of breath, chest pain, weakness, dizziness, nausea/vomiting, body aches.  No current facility-administered medications for this encounter.  Current Outpatient Medications:    amoxicillin -clavulanate (AUGMENTIN) 875-125 MG tablet, Take 1 tablet by mouth every 12 (twelve) hours., Disp: 14 tablet, Rfl: 0   azelastine (ASTELIN) 0.1 % nasal spray, Place 1 spray into both nostrils 2 (two) times daily. Use in each nostril as directed, Disp: 30 mL, Rfl: 1   promethazine-dextromethorphan (PROMETHAZINE-DM) 6.25-15 MG/5ML syrup, Take 10 mLs by mouth 3 (three) times daily as needed., Disp: 240 mL, Rfl: 0   cetirizine  (ZYRTEC ) 10 MG tablet, Take 1 tablet (10 mg total) by mouth daily for 10 days. (Patient not taking: Reported on 09/27/2024), Disp: 10 tablet, Rfl: 0   erythromycin  with ethanol (EMGEL ) 2 % gel, Apply topically daily. (Patient not taking: Reported on 09/27/2024), Disp: 30 g, Rfl: 2   famotidine  (PEPCID ) 20 MG tablet, Take 20 mg by mouth daily. (Patient not taking: Reported on 09/27/2024), Disp: , Rfl:    fluticasone  (FLONASE ) 50 MCG/ACT nasal spray, Place 1 spray into both nostrils daily for 3 days. (Patient not taking: Reported on 09/27/2024), Disp: 16 g, Rfl: 0   HYDROcodone-acetaminophen  (NORCO/VICODIN) 5-325 MG tablet, Take 1 tablet every 6 hours by oral route. (Patient not taking:  Reported on 09/27/2024), Disp: , Rfl:    hyoscyamine  (OSCIMIN ) 0.125 MG SL tablet, Take 1 tablet (0.125 mg total) by mouth every 6 (six) hours as needed for cramping (diarrhea). (Patient not taking: Reported on 09/27/2024), Disp: 30 tablet, Rfl: 1   meloxicam  (MOBIC ) 7.5 MG tablet, Take 1 tablet (7.5 mg total) by mouth daily. (Patient not taking: Reported on 09/27/2024), Disp: 7 tablet, Rfl: 0   NEEDLE, DISP, 18 G (BD BLUNT FILTER NEEDLE) 18G X 1-1/2 MISC, Use as directed for testosterone  injection (Patient not taking: Reported on 09/27/2024), Disp: 12 each, Rfl: 12   NEEDLE, DISP, 23 G 23G X 1 MISC, 1 Units by Does not apply route once a week. (Patient not taking: Reported on 09/27/2024), Disp: 25 each, Rfl: 4   NEEDLE, DISP, 25 G (B-D DISP NEEDLE 25GX1) 25G X 1 MISC, Use weekly as directed to inject testosterone  under skin as directed (Patient not taking: Reported on 09/27/2024), Disp: 50 each, Rfl: 3   omeprazole  (PRILOSEC) 20 MG capsule, Take 20 mg by mouth daily. (Patient not taking: Reported on 09/27/2024), Disp: , Rfl:    ondansetron  (ZOFRAN -ODT) 4 MG disintegrating tablet, Take 1 tablet (4 mg total) by mouth every 8 (eight) hours as needed for nausea or vomiting. (Patient not taking: Reported on 09/27/2024), Disp: 20 tablet, Rfl: 0   Syringe, Disposable, (B-D SYRINGE LUER-LOK 1CC) 1 ML MISC, Use as directed with testosterone  injection (Patient not taking: Reported on 09/27/2024), Disp: 50 each, Rfl: 2   SYRINGE-NEEDLE, DISP, 3 ML (BD LUER-LOCK SYRINGE) 18G X 1-1/2 3 ML MISC, Use as directed (Patient not taking: Reported on  09/27/2024), Disp: 50 each, Rfl: 3   testosterone  cypionate (DEPOTESTOSTERONE CYPIONATE) 200 MG/ML injection, Inject 0.2 ml whichis 40 mg under skin as directed weekly (Patient not taking: Reported on 09/27/2024), Disp: 10 mL, Rfl: 1   No Known Allergies  Past Medical History:  Diagnosis Date   No pertinent past medical history      Past Surgical History:   Procedure Laterality Date   I & D EXTREMITY Right 04/14/2016   Procedure: REMOVAL FOREIGN BODY RIGHT HAND;  Surgeon: Donnice Robinsons, MD;  Location: [facility] OR;  Service: Orthopedics;  Laterality: Right;    Family History  Problem Relation Age of Onset   Hypertension Mother    Hypertension Father    Colon cancer Maternal Grandmother    Liver cancer Neg Hx    Esophageal cancer Neg Hx     Social History   Tobacco Use   Smoking status: Never    Passive exposure: Current   Smokeless tobacco: Current    Types: Chew  Vaping Use   Vaping status: Never Used  Substance Use Topics   Alcohol use: Yes    Comment: Moonshine most recently.   Drug use: Not Currently    ROS Refer to HPI for ROS details.  Objective:   Vitals: BP 121/79 (BP Location: Left Arm)   Pulse 75   Temp 98.3 F (36.8 C) (Oral)   Resp 14   SpO2 98%   Physical Exam Vitals and nursing note reviewed.  Constitutional:      General: He is not in acute distress.    Appearance: Normal appearance. He is well-developed.  HENT:     Head: Normocephalic and atraumatic.     Nose: Nasal tenderness, mucosal edema, congestion and rhinorrhea present.     Right Turbinates: Swollen.     Left Turbinates: Swollen.     Right Sinus: Maxillary sinus tenderness present. No frontal sinus tenderness.     Left Sinus: Maxillary sinus tenderness present. No frontal sinus tenderness.  Eyes:     Conjunctiva/sclera: Conjunctivae normal.  Cardiovascular:     Rate and Rhythm: Normal rate and regular rhythm.     Heart sounds: No murmur heard. Pulmonary:     Effort: Pulmonary effort is normal. No respiratory distress.     Breath sounds: Normal breath sounds.  Abdominal:     Palpations: Abdomen is soft.     Tenderness: There is no abdominal tenderness.  Musculoskeletal:        General: No swelling.     Cervical back: Neck supple.  Skin:    General: Skin is warm and dry.     Capillary Refill: Capillary refill takes less than 2  seconds.  Neurological:     Mental Status: He is alert.  Psychiatric:        Mood and Affect: Mood normal.     Procedures  No results found for this or any previous visit (from the past 24 hours).  No results found.   Assessment and Plan :     Discharge Instructions       1. Acute non-recurrent maxillary sinusitis (Primary) - amoxicillin -clavulanate (AUGMENTIN) 875-125 MG tablet; Take 1 tablet by mouth every 12 (twelve) hours.  Dispense: 14 tablet; Refill: 0 - azelastine (ASTELIN) 0.1 % nasal spray; Place 1 spray into both nostrils 2 (two) times daily. Use in each nostril as directed  Dispense: 30 mL; Refill: 1 - promethazine-dextromethorphan (PROMETHAZINE-DM) 6.25-15 MG/5ML syrup; Take 10 mLs by mouth 3 (three) times daily as needed.  Dispense: 240 mL; Refill: 0 -Continue to monitor symptoms for any change in severity if there is any escalation of current symptoms or development of new symptoms follow-up in ER for further evaluation and management.       Jasyah Theurer B Burnie Therien   Jocelynn Gioffre, [place] B, [place] 09/27/24 [phone]

## 2024-09-28 ENCOUNTER — Encounter: Payer: Self-pay | Admitting: Family Medicine

## 2024-09-28 DIAGNOSIS — Z789 Other specified health status: Secondary | ICD-10-CM

## 2024-09-28 MED ORDER — BD LUER-LOCK SYRINGE 18G X 1-1/2" 3 ML MISC: 3 refills | Status: AC

## 2024-09-28 MED ORDER — TESTOSTERONE CYPIONATE 200 MG/ML IM SOLN: INTRAMUSCULAR | 1 refills | Status: AC

## 2024-11-16 ENCOUNTER — Ambulatory Visit (INDEPENDENT_AMBULATORY_CARE_PROVIDER_SITE_OTHER)

## 2024-11-16 ENCOUNTER — Ambulatory Visit: Admission: EM | Admit: 2024-11-16 | Discharge: 2024-11-16 | Disposition: A | Discharge status: Home / Self Care

## 2024-11-16 ENCOUNTER — Ambulatory Visit: Payer: Self-pay

## 2024-11-16 ENCOUNTER — Encounter: Payer: Self-pay | Admitting: Emergency Medicine

## 2024-11-16 DIAGNOSIS — U071 COVID-19: Secondary | ICD-10-CM

## 2024-11-16 LAB — POC COVID19/FLU A&B COMBO
Covid Antigen, POC: POSITIVE — AB
Influenza A Antigen, POC: NEGATIVE
Influenza B Antigen, POC: NEGATIVE

## 2024-11-16 MED ORDER — PROMETHAZINE-DM 6.25-15 MG/5ML PO SYRP: 5.0000 mL | ORAL_SOLUTION | Freq: Four times a day (QID) | ORAL | 0 refills | Status: AC | PRN

## 2024-11-16 NOTE — ED Triage Notes (Signed)
 Pt presents c/o URI x several weeks. Pt states,  I'm not sure which sxs started first but I have a cough and sore throat. I get in these coughing fits where I'm about to throw up b/c I'm coughing so much. My body aches too and a little sneezing. I also have a runny nose and I'm coughing up green mucus. It hurts in my chest when I cough.  Pt denies diarrhea but does c/o emesis after a coughing fit. No at home test completed. Pt has taken OTC Cold and Flu cough syrup with no improvement in sxs.    Pt reports he has had the sxs since 09/27/24. He states that he has never really recovered from the original visit for the same sxs.

## 2024-11-16 NOTE — ED Provider Notes (Signed)
 " [name]    CSN: [id number] Arrival date & time: 11/16/24  1046      History   Chief Complaint Chief Complaint  Patient presents with   URI    HPI Tina Walsh is a 28 y.o. adult.  Patient presents to urgent care today with concerns of URI.  Reports experiencing URI type symptoms for several weeks initially believing symptoms began around late October and had slightly improved but have now rebounded and worsened.  Endorses continued cough that is largely nonproductive but has occasionally coughed up green mucus.  He endorses some chest discomfort with coughing.  Denies any nausea or vomiting or diarrhea but does report an episode of posttussive emesis.  It is currently taking over-the-counter medications with minimal improvement in symptoms.   URI Presenting symptoms: cough     Past Medical History:  Diagnosis Date   No pertinent past medical history     Patient Active Problem List   Diagnosis Date Noted   Unspecified synovitis and tenosynovitis, unspecified forearm 06/05/2024   Right wrist pain 06/04/2024   Diarrhea 01/27/2024   Acute postoperative pain 11/01/2023   Ganglion cyst of dorsum of right wrist 10/19/2023   Mass of joint of right wrist 10/19/2023   Tonsil stone 02/03/2022   Acne nodule 09/07/2018   Severe episode of recurrent major depressive disorder, without psychotic features (HCC) 09/07/2018   Female-to-female transgender person 09/07/2018   Gastroesophageal reflux disease 09/07/2018   Closed displaced fracture of styloid process of right ulna with routine healing 04/20/2016   Closed intra-articular die-punch fracture of radius with routine healing 04/20/2016   Superficial foreign body of right hand 04/20/2016    Past Surgical History:  Procedure Laterality Date   I & D EXTREMITY Right 04/14/2016   Procedure: REMOVAL FOREIGN BODY RIGHT HAND;  Surgeon: Donnice Robinsons, MD;  Location: [facility] OR;  Service: Orthopedics;  Laterality: Right;     OB History   No obstetric history on file.      Home Medications    Prior to Admission medications  Medication Sig Start Date End Date Taking? Authorizing Provider  B-D 3CC LUER-LOK SYR 25GX1 25G X 1 3 ML MISC Inject into the skin once a week. 11/13/24  Yes [provider]  promethazine -dextromethorphan (PROMETHAZINE -DM) 6.25-15 MG/5ML syrup Take 5 mLs by mouth 4 (four) times daily as needed for cough. 11/16/24  Yes Pawan Knechtel A, PA-C  amoxicillin -clavulanate (AUGMENTIN ) 875-125 MG tablet Take 1 tablet by mouth every 12 (twelve) hours. 09/27/24   Reddick, Johnathan B, NP  azelastine  (ASTELIN ) 0.1 % nasal spray Place 1 spray into both nostrils 2 (two) times daily. Use in each nostril as directed 09/27/24   Reddick, Johnathan B, NP  cetirizine  (ZYRTEC ) 10 MG tablet Take 1 tablet (10 mg total) by mouth daily for 10 days. Patient not taking: Reported on 09/27/2024 09/23/21 10/03/21  Hazen Darryle BRAVO, FNP  erythromycin  with ethanol (EMGEL ) 2 % gel Apply topically daily. Patient not taking: Reported on 09/27/2024 06/13/23 06/12/24  Rosalynn Camie LITTIE, MD  famotidine  (PEPCID ) 20 MG tablet Take 20 mg by mouth daily. Patient not taking: Reported on 09/27/2024 03/21/16   [provider]  fluticasone  (FLONASE ) 50 MCG/ACT nasal spray Place 1 spray into both nostrils daily for 3 days. Patient not taking: Reported on 09/27/2024 09/23/21 09/26/21  Mound, Haley E, FNP  HYDROcodone-acetaminophen  (NORCO/VICODIN) 5-325 MG tablet Take 1 tablet every 6 hours by oral route. Patient not taking: Reported on 09/27/2024 11/01/23  [provider]  hyoscyamine  (OSCIMIN ) 0.125 MG SL tablet Take 1 tablet (0.125 mg total) by mouth every 6 (six) hours as needed for cramping (diarrhea). Patient not taking: Reported on 09/27/2024 04/05/24   Craig Alan SAUNDERS, PA-C  meloxicam  (MOBIC ) 7.5 MG tablet Take 1 tablet (7.5 mg total) by mouth daily. Patient not taking: Reported on 09/27/2024 04/13/22    Rising, Rebecca, PA-C  NEEDLE, DISP, 18 G (BD BLUNT FILTER NEEDLE) 18G X 1-1/2 MISC Use as directed for testosterone  injection Patient not taking: Reported on 09/27/2024 10/11/23   Rosalynn Camie CROME, MD  NEEDLE, DISP, 23 G 23G X 1 MISC 1 Units by Does not apply route once a week. Patient not taking: Reported on 09/27/2024 10/11/23   Rosalynn Camie CROME, MD  NEEDLE, DISP, 25 G (B-D DISP NEEDLE 25GX1) 25G X 1 MISC Use weekly as directed to inject testosterone  under skin as directed Patient not taking: Reported on 09/27/2024 01/25/24   Rosalynn Camie CROME, MD  omeprazole  (PRILOSEC) 20 MG capsule Take 20 mg by mouth daily. Patient not taking: Reported on 09/27/2024    [provider]  ondansetron  (ZOFRAN -ODT) 4 MG disintegrating tablet Take 1 tablet (4 mg total) by mouth every 8 (eight) hours as needed for nausea or vomiting. Patient not taking: Reported on 09/27/2024 04/05/24   Craig Alan SAUNDERS, PA-C  Syringe, Disposable, (B-D SYRINGE LUER-LOK 1CC) 1 ML MISC Use as directed with testosterone  injection Patient not taking: Reported on 09/27/2024 12/01/22   Rosalynn Camie CROME, MD  SYRINGE-NEEDLE, DISP, 3 ML (BD LUER-LOCK SYRINGE) 18G X 1-1/2 3 ML MISC Use as directed 09/28/24   Rosalynn Camie CROME, MD  testosterone  cypionate (DEPOTESTOSTERONE CYPIONATE) 200 MG/ML injection Inject 0.2 ml whichis 40 mg under skin as directed weekly 09/28/24   Rosalynn Camie CROME, MD    Family History Family History  Problem Relation Age of Onset   Hypertension Mother    Hypertension Father    Colon cancer Maternal Grandmother    Liver cancer Neg Hx    Esophageal cancer Neg Hx     Social History Social History[1]   Allergies   Patient has no known allergies.   Review of Systems Review of Systems  Respiratory:  Positive for cough.   All other systems reviewed and are negative.    Physical Exam Triage Vital Signs ED Triage Vitals  Encounter Vitals Group     BP 11/16/24 1202 119/83     Girls Systolic BP Percentile --       Girls Diastolic BP Percentile --      Boys Systolic BP Percentile --      Boys Diastolic BP Percentile --      Pulse Rate 11/16/24 1202 88     Resp 11/16/24 1202 18     Temp 11/16/24 1202 98.4 F (36.9 C)     Temp Source 11/16/24 1202 Oral     SpO2 11/16/24 1202 96 %     Weight 11/16/24 1202 167 lb 15.9 oz (76.2 kg)     Height --      Head Circumference --      Peak Flow --      Pain Score 11/16/24 1200 4     Pain Loc --      Pain Education --      Exclude from Growth Chart --    No data found.  Updated Vital Signs BP 119/83 (BP Location: Right Arm)   Pulse 88   Temp 98.4 F (36.9  C) (Oral)   Resp 18   Wt 167 lb 15.9 oz (76.2 kg)   SpO2 96%   BMI 29.76 kg/m   Visual Acuity Right Eye Distance:   Left Eye Distance:   Bilateral Distance:    Right Eye Near:   Left Eye Near:    Bilateral Near:     Physical Exam Vitals and nursing note reviewed.  Constitutional:      General: He is not in acute distress.    Appearance: He is well-developed.  HENT:     Head: Normocephalic and atraumatic.  Eyes:     Conjunctiva/sclera: Conjunctivae normal.  Cardiovascular:     Rate and Rhythm: Normal rate and regular rhythm.     Heart sounds: No murmur heard. Pulmonary:     Effort: Pulmonary effort is normal. No respiratory distress.     Breath sounds: Normal breath sounds. No wheezing or rales.  Abdominal:     Palpations: Abdomen is soft.     Tenderness: There is no abdominal tenderness.  Musculoskeletal:        General: No swelling.     Cervical back: Neck supple.  Skin:    General: Skin is warm and dry.     Capillary Refill: Capillary refill takes less than 2 seconds.  Neurological:     Mental Status: He is alert.  Psychiatric:        Mood and Affect: Mood normal.      UC Treatments / Results  Labs (all labs ordered are listed, but only abnormal results are displayed) Labs Reviewed  POC COVID19/FLU A&B COMBO - Abnormal; Notable for the following components:       Result Value   Covid Antigen, POC Positive (*)    All other components within normal limits    EKG   Radiology No results found.  Procedures Procedures (including critical care time)  Medications Ordered in UC Medications - No data to display  Initial Impression / Assessment and Plan / UC Course  I have reviewed the triage vital signs and the nursing notes.  Pertinent labs & imaging results that were available during my care of the patient were reviewed by me and considered in my medical decision making (see chart for details).     This patient presents to the UC for concern of cough, congestion.  Differential diagnosis includes COVID-19, influenza, bronchitis, pneumonia    Additional history obtained:  Additional history obtained from chart review   Lab Tests:  I Ordered, and personally interpreted labs.  The pertinent results include: COVID-19 positive, influenza negative for influenza A/B   Imaging Studies ordered:  I ordered imaging studies including chest x-ray I independently visualized and interpreted imaging which showed pending but no clear findings on my interpretation I agree with the radiologist interpretation   Problem List / UC Course:  Patient with past history significant for GERD presents to urgent care today with concerns of URI symptoms.  Reports ongoing cough, body aches, and congestion for the last several weeks.  States symptoms initially began on 09/27/2024 and have slightly improved but have now worsened again.  Denies any appreciable fevers at home that he has measured.  No sick contacts as far as she is aware. On exam, there is no appreciable wheezing, rales, or rhonchi.  Oropharynx is at this time not erythematous no appreciable tonsillar exudate.  Uvula is midline, low concern for PTA. Based on current presentation, suspect likely viral URI.  Will obtain point-of-care swab and chest x-ray  for evaluation.  No evidence of respiratory distress  at this time. Patient's workup is positive for COVID-19.  Chest x-ray negative for any acute findings although pending formal radiology read.  Advised patient given positive COVID finding, suspect this is likely source of symptoms.  Will send a prescription cough medication but will hold off on antiviral therapy.  Encourage hygiene and precautions with high risk populations.  Otherwise stable for outpatient follow-up and discharged home.   Social Determinants of Health:  None  Final Clinical Impressions(s) / UC Diagnoses   Final diagnoses:  COVID-19     Discharge Instructions      You were seen at urgent care today for concerns of cough.  You tested positive for COVID-19.  This is likely what is causing your current symptoms.  Your chest x-ray is still pending but given your positive COVID-19 finding, I do suspect this is the source of your symptoms.  I have sent a prescription cough medication to your pharmacy.  Please take this as prescribed.  For any concerns of new or worsening symptoms, return to urgent care or seek evaluation with department care prior.     ED Prescriptions     Medication Sig Dispense Auth. Provider   promethazine -dextromethorphan (PROMETHAZINE -DM) 6.25-15 MG/5ML syrup Take 5 mLs by mouth 4 (four) times daily as needed for cough. 118 mL Jamelyn Bovard A, PA-C      PDMP not reviewed this encounter.     [1]  Social History Tobacco Use   Smoking status: Never    Passive exposure: Current   Smokeless tobacco: Current    Types: Chew  Vaping Use   Vaping status: Never Used  Substance Use Topics   Alcohol use: Yes    Comment: Moonshine most recently.   Drug use: Not Currently     Densel Kronick A, PA-C 11/16/24 1304  "

## 2024-11-16 NOTE — Discharge Instructions (Addendum)
 You were seen at urgent care today for concerns of cough.  You tested positive for COVID-19.  This is likely what is causing your current symptoms.  Your chest x-ray is still pending but given your positive COVID-19 finding, I do suspect this is the source of your symptoms.  I have sent a prescription cough medication to your pharmacy.  Please take this as prescribed.  For any concerns of new or worsening symptoms, return to urgent care or seek evaluation with department care prior.
